# Patient Record
Sex: Male | Born: 1965 | Hispanic: Yes | Marital: Married | Smoking: Never smoker
Health system: Southern US, Community
[De-identification: ages and names within clinical notes are randomized; demographics above are authoritative.]

## PROBLEM LIST (undated history)

## (undated) DIAGNOSIS — I10 Essential (primary) hypertension: Secondary | ICD-10-CM

## (undated) HISTORY — DX: Essential (primary) hypertension: I10

## (undated) HISTORY — PX: OTHER SURGICAL HISTORY: SHX169

## (undated) HISTORY — PX: UMBILICAL HERNIA REPAIR: SHX196

---

## 2011-04-03 DIAGNOSIS — K5900 Constipation, unspecified: Secondary | ICD-10-CM | POA: Insufficient documentation

## 2011-04-03 DIAGNOSIS — E669 Obesity, unspecified: Secondary | ICD-10-CM | POA: Insufficient documentation

## 2013-06-02 ENCOUNTER — Encounter (INDEPENDENT_AMBULATORY_CARE_PROVIDER_SITE_OTHER): Payer: Self-pay

## 2013-06-02 ENCOUNTER — Encounter: Payer: Self-pay | Admitting: Family Medicine

## 2013-06-02 ENCOUNTER — Ambulatory Visit (INDEPENDENT_AMBULATORY_CARE_PROVIDER_SITE_OTHER): Payer: Managed Care, Other (non HMO) | Admitting: Family Medicine

## 2013-06-02 VITALS — BP 134/88 | HR 60 | Temp 97.3°F | Ht 67.5 in | Wt 223.0 lb

## 2013-06-02 DIAGNOSIS — Z0289 Encounter for other administrative examinations: Secondary | ICD-10-CM

## 2013-06-02 DIAGNOSIS — Z0000 Encounter for general adult medical examination without abnormal findings: Secondary | ICD-10-CM

## 2013-06-02 DIAGNOSIS — I10 Essential (primary) hypertension: Secondary | ICD-10-CM

## 2013-06-02 LAB — POCT URINALYSIS DIPSTICK
Bilirubin, UA: NEGATIVE
Blood, UA: NEGATIVE
Glucose, UA: NEGATIVE
Ketones, UA: NEGATIVE
Leukocytes, UA: NEGATIVE
Nitrite, UA: NEGATIVE
Protein, UA: NEGATIVE
Spec Grav, UA: 1.01
Urobilinogen, UA: NEGATIVE
pH, UA: 6.5

## 2013-06-02 MED ORDER — BISOPROLOL-HYDROCHLOROTHIAZIDE 5-6.25 MG PO TABS: 1.0000 | ORAL_TABLET | Freq: Every day | ORAL | Status: DC

## 2013-06-02 NOTE — Patient Instructions (Signed)
Testicular Self-Exam  A self-examination of your testicles involves looking at and feeling your testicles for abnormal lumps or swelling. Several things can cause swelling, lumps, or pain in your testicles. Some of these causes are:  · Injuries.  · Inflammation.  · Infection.  · Accumulation of fluids around your testicle (hydrocele).  · Twisted testicles (testicular torsion).  · Testicular cancer.  Self-examination of the testicles and groin areas may be advised if you are at risk for testicular cancer. Risks for testicular cancer include:  · An undescended testicle (cryptorchidism).  · A history of previous testicular cancer.  · A family history of testicular cancer.  The testicles are easiest to examine after warm baths or showers and are more difficult to examine when you are cold. This is because the muscles attached to the testicles retract and pull them up higher or into the abdomen.  Follow these steps while you are standing:  · Hold your penis away from your body.  · Roll one testicle between your thumb and forefinger, feeling the entire testicle.  · Roll the other testicle between your thumb and forefinger, feeling the entire testicle.  Feel for lumps, swelling, or discomfort. A normal testicle is egg shaped and feels firm. It is smooth and not tender. The spermatic cord can be felt as a firm spaghetti-like cord at the back of your testicle. It is also important to examine the crease between the front of your leg and your abdomen. Feel for any bumps that are tender. These could be enlarged lymph nodes.   Document Released: 08/17/2000 Document Revised: 01/11/2013 Document Reviewed: 10/31/2012  [employer]® Patient Information ©2014 [employer].

## 2013-06-02 NOTE — Progress Notes (Signed)
   Subjective:    Patient ID: Keith BanasJorge Thornsberry, male    DOB: 1965-08-09, 48 y.o.   MRN: [id number]  HPI This 48 y.o. 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  He is up to date with his labs this year But needs a DOT physical.  He has no acute complaints.  He has hx of hypertension and  Needs refills.   Review of Systems No chest pain, SOB, HA, dizziness, vision change, N/V, diarrhea, constipation, dysuria, urinary urgency or frequency, myalgias, arthralgias or rash.     Objective:   Physical Exam  Vital signs noted  Well developed well nourished male.  HEENT - Head atraumatic Normocephalic                Eyes - PERRLA, Conjuctiva - clear Sclera- Clear EOMI                Ears - EAC's Wnl TM's Wnl Gross Hearing WNL                Nose - Nares patent                 Throat - oropharanx wnl Respiratory - Lungs CTA bilateral Cardiac - RRR S1 and S2 without murmur GI - Abdomen soft Nontender and bowel sounds active x 4 Extremities - No edema. Neuro - Grossly intact.      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  Deatra CanterWilliam J Oxford FNP

## 2013-07-21 ENCOUNTER — Ambulatory Visit (INDEPENDENT_AMBULATORY_CARE_PROVIDER_SITE_OTHER): Payer: Managed Care, Other (non HMO) | Admitting: Family Medicine

## 2013-07-21 ENCOUNTER — Ambulatory Visit (INDEPENDENT_AMBULATORY_CARE_PROVIDER_SITE_OTHER): Payer: Managed Care, Other (non HMO)

## 2013-07-21 VITALS — BP 131/92 | HR 61 | Temp 97.1°F | Ht 67.0 in | Wt 222.0 lb

## 2013-07-21 DIAGNOSIS — M549 Dorsalgia, unspecified: Secondary | ICD-10-CM

## 2013-07-21 DIAGNOSIS — M542 Cervicalgia: Secondary | ICD-10-CM

## 2013-07-21 MED ORDER — NAPROXEN 500 MG PO TABS: 500.0000 mg | ORAL_TABLET | Freq: Two times a day (BID) | ORAL | Status: DC

## 2013-07-21 MED ORDER — CYCLOBENZAPRINE HCL 10 MG PO TABS: 10.0000 mg | ORAL_TABLET | Freq: Three times a day (TID) | ORAL | Status: DC | PRN

## 2013-07-21 NOTE — Progress Notes (Signed)
   Subjective:    Patient ID: Keith Blake, male    DOB: 03/28/1966, 48 y.o.   MRN: [id number]  HPI This 48 y.o. male presents for evaluation of back pain that is getting worse.  He states he has  Had back pain for 5 years and it is getting worse.  He c/o night time sx's and pain that effects his Sleeping.  He has been having pain in his mid back to his neck.   Review of Systems No chest pain, SOB, HA, dizziness, vision change, N/V, diarrhea, constipation, dysuria, urinary urgency or frequency or rash.     Objective:   Physical Exam  Vital signs noted  Well developed well nourished male.  HEENT - Head atraumatic Normocephalic                Eyes - PERRLA, Conjuctiva - clear Sclera- Clear EOMI Respiratory - Lungs CTA bilateral Cardiac - RRR S1 and S2 without murmur MS - TTP thoracic and cervical spine paraspinous muscles Neuro - Grossly intact.  Thoracic and cervical spine xrays -  No fracture Prelimnary reading by Chrissie NoaWilliam Oxford,FNP     Assessment & Plan:  Cervicalgia - Plan: naproxen (NAPROSYN) 500 MG tablet, cyclobenzaprine (FLEXERIL) 10 MG tablet  Back Pain - Naprosyn 500mg  one po bid x 10 - 14 days and flexeril 10mg  one po tid prn  Deatra CanterWilliam J Oxford FNP

## 2013-10-20 ENCOUNTER — Ambulatory Visit (INDEPENDENT_AMBULATORY_CARE_PROVIDER_SITE_OTHER): Payer: Managed Care, Other (non HMO) | Admitting: Family Medicine

## 2013-10-20 ENCOUNTER — Encounter: Payer: Self-pay | Admitting: Family Medicine

## 2013-10-20 VITALS — BP 140/90 | HR 63 | Temp 98.1°F | Ht 67.0 in | Wt 220.0 lb

## 2013-10-20 DIAGNOSIS — R109 Unspecified abdominal pain: Secondary | ICD-10-CM

## 2013-10-20 DIAGNOSIS — R1030 Lower abdominal pain, unspecified: Secondary | ICD-10-CM

## 2013-10-20 DIAGNOSIS — N419 Inflammatory disease of prostate, unspecified: Secondary | ICD-10-CM

## 2013-10-20 LAB — POCT UA - MICROSCOPIC ONLY
Bacteria, U Microscopic: NEGATIVE
Casts, Ur, LPF, POC: NEGATIVE
Crystals, Ur, HPF, POC: NEGATIVE
Mucus, UA: NEGATIVE
RBC, urine, microscopic: NEGATIVE
WBC, Ur, HPF, POC: NEGATIVE
Yeast, UA: NEGATIVE

## 2013-10-20 LAB — POCT URINALYSIS DIPSTICK
Bilirubin, UA: NEGATIVE
Blood, UA: NEGATIVE
Glucose, UA: NEGATIVE
Ketones, UA: NEGATIVE
Leukocytes, UA: NEGATIVE
Nitrite, UA: NEGATIVE
Protein, UA: NEGATIVE
Spec Grav, UA: 1.01
Urobilinogen, UA: NEGATIVE
pH, UA: 7

## 2013-10-20 MED ORDER — TAMSULOSIN HCL 0.4 MG PO CAPS: 0.4000 mg | ORAL_CAPSULE | Freq: Every day | ORAL | Status: DC

## 2013-10-20 MED ORDER — CIPROFLOXACIN HCL 500 MG PO TABS: 500.0000 mg | ORAL_TABLET | Freq: Two times a day (BID) | ORAL | Status: DC

## 2013-10-20 NOTE — Progress Notes (Signed)
   Subjective:    Patient ID: Keith Blake, male    DOB: 1966-04-25, 48 y.o.   MRN: [id number]  HPI This 48 y.o. male presents for evaluation of having some discomfort in his perineum. He c/o decreased urine stream and urinary frequency.   Review of Systems C/o urinary frequency No chest pain, SOB, HA, dizziness, vision change, N/V, diarrhea, constipation, myalgias, arthralgias or rash.     Objective:   Physical Exam Vital signs noted  Well developed well nourished male.  HEENT - Head atraumatic Normocephalic                Eyes - PERRLA, Conjuctiva - clear Sclera- Clear EOMI                Ears - EAC's Wnl TM's Wnl Gross Hearing WNL                Throat - oropharanx wnl Respiratory - Lungs CTA bilateral Cardiac - RRR S1 and S2 without murmur GI - Abdomen soft Nontender and bowel sounds active x 4 Extremities - No edema. Neuro - Grossly intact.  Results for orders placed in visit on 10/20/13  POCT UA - MICROSCOPIC ONLY      Result Value Ref Range   WBC, Ur, HPF, POC neg     RBC, urine, microscopic neg     Bacteria, U Microscopic neg     Mucus, UA neg     Epithelial cells, urine per micros occ     Crystals, Ur, HPF, POC neg     Casts, Ur, LPF, POC neg     Yeast, UA neg    POCT URINALYSIS DIPSTICK      Result Value Ref Range   Color, UA yellow     Clarity, UA clear     Glucose, UA neg     Bilirubin, UA neg     Ketones, UA neg     Spec Grav, UA 1.010     Blood, UA neg     pH, UA 7.0     Protein, UA neg     Urobilinogen, UA negative     Nitrite, UA neg     Leukocytes, UA Negative         Assessment & Plan:  Groin discomfort - Plan: POCT UA - Microscopic Only, POCT urinalysis dipstick, tamsulosin (FLOMAX) 0.4 MG CAPS capsule, ciprofloxacin (CIPRO) 500 MG tablet, Urine culture  Prostatitis - Plan: tamsulosin (FLOMAX) 0.4 MG CAPS capsule, ciprofloxacin (CIPRO) 500 MG tablet, Urine culture  Deatra Canter FNP

## 2013-10-22 LAB — URINE CULTURE: Organism ID, Bacteria: NO GROWTH

## 2013-11-28 ENCOUNTER — Ambulatory Visit (INDEPENDENT_AMBULATORY_CARE_PROVIDER_SITE_OTHER): Payer: Managed Care, Other (non HMO) | Admitting: Family Medicine

## 2013-11-28 VITALS — BP 121/82 | HR 63 | Temp 98.4°F | Ht 67.0 in | Wt 219.0 lb

## 2013-11-28 DIAGNOSIS — L259 Unspecified contact dermatitis, unspecified cause: Secondary | ICD-10-CM

## 2013-11-28 MED ORDER — METHYLPREDNISOLONE (PAK) 4 MG PO TABS: ORAL_TABLET | ORAL | Status: DC

## 2013-11-28 MED ORDER — METHYLPREDNISOLONE ACETATE 80 MG/ML IJ SUSP
80.0000 mg | Freq: Once | INTRAMUSCULAR | Status: AC
  Administered 2013-11-28: 80 mg via INTRAMUSCULAR

## 2013-11-28 MED ORDER — HYDROXYZINE HCL 25 MG PO TABS: 25.0000 mg | ORAL_TABLET | Freq: Three times a day (TID) | ORAL | Status: DC | PRN

## 2013-11-28 NOTE — Progress Notes (Signed)
   Subjective:    Patient ID: Keith Blake, male    DOB: November 22, 1965, 48 y.o.   MRN: [id number]  HPI  Patient c/o rash.  He has recently started to take flomax  Review of Systems C/o rash   No chest pain, SOB, HA, dizziness, vision change, N/V, diarrhea, constipation, dysuria, urinary urgency or frequency, myalgias, arthralgias.  Objective:   Physical Exam   Rash on upper extremities, abdomen, chest, and back, rash raised, erythematous, and confluent     Assessment & Plan:  Contact dermatitis - Plan: methylPREDNISolone acetate (DEPO-MEDROL) injection 80 mg, methylPREDNIsolone (MEDROL DOSPACK) 4 MG tablet, hydrOXYzine (ATARAX/VISTARIL) 25 MG tablet Could also be an allergy so recommend stop flomax  Deatra CanterWilliam J Cesily Cuoco FNP

## 2014-04-15 IMAGING — CR DG CERVICAL SPINE COMPLETE 4+V
5 series · 5 of 5 positions shown · non-contrast
Comparison: None.

CLINICAL DATA: Pain.

EXAM:
CERVICAL SPINE  4+ VIEWS

[view not recorded (1 of 5)]
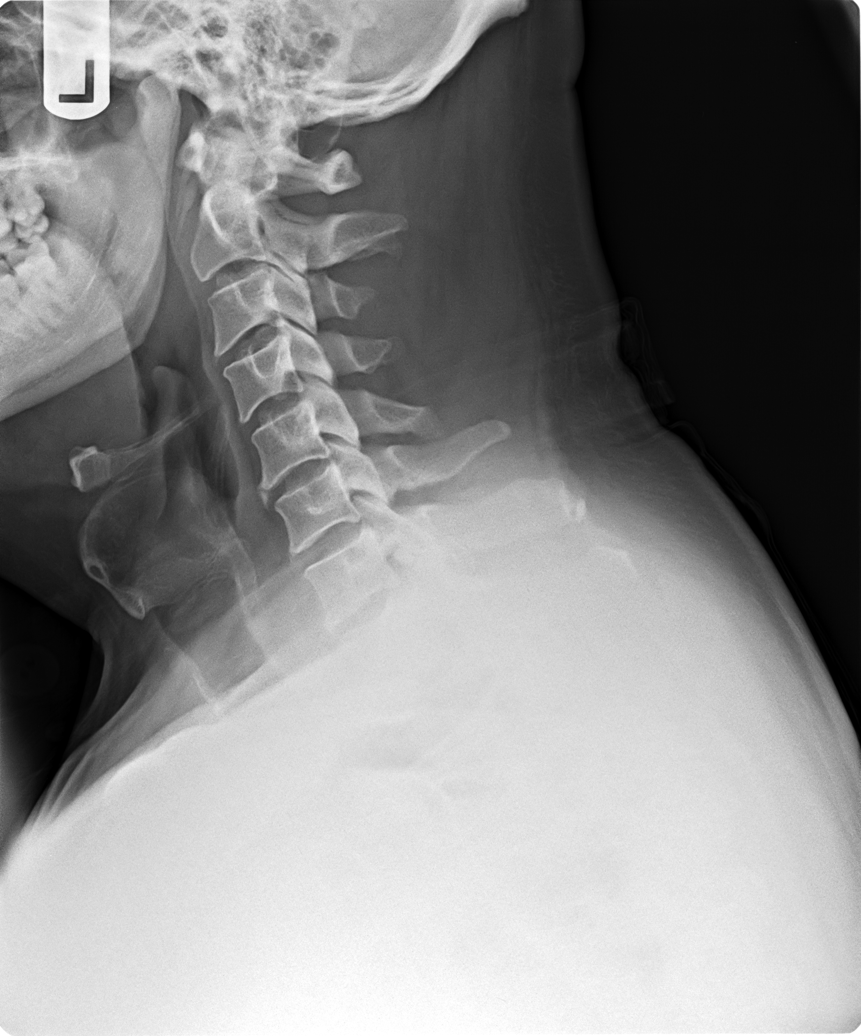

[view not recorded (2 of 5)]
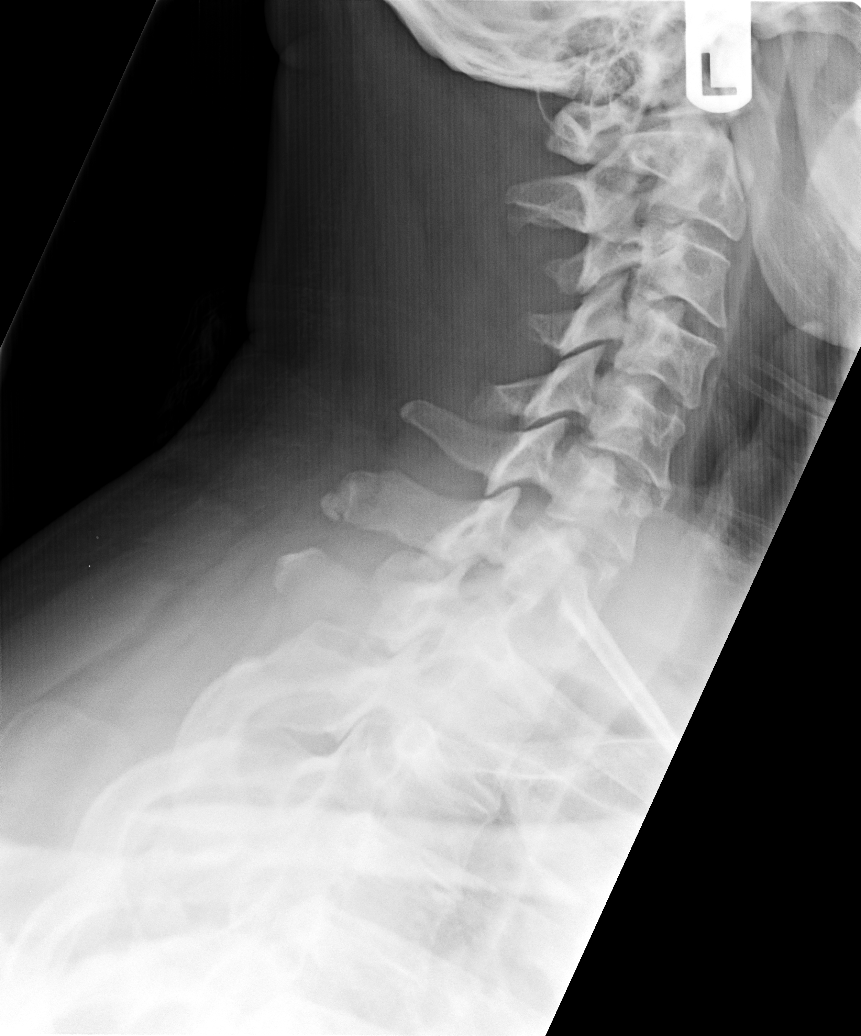

[view not recorded (3 of 5)]
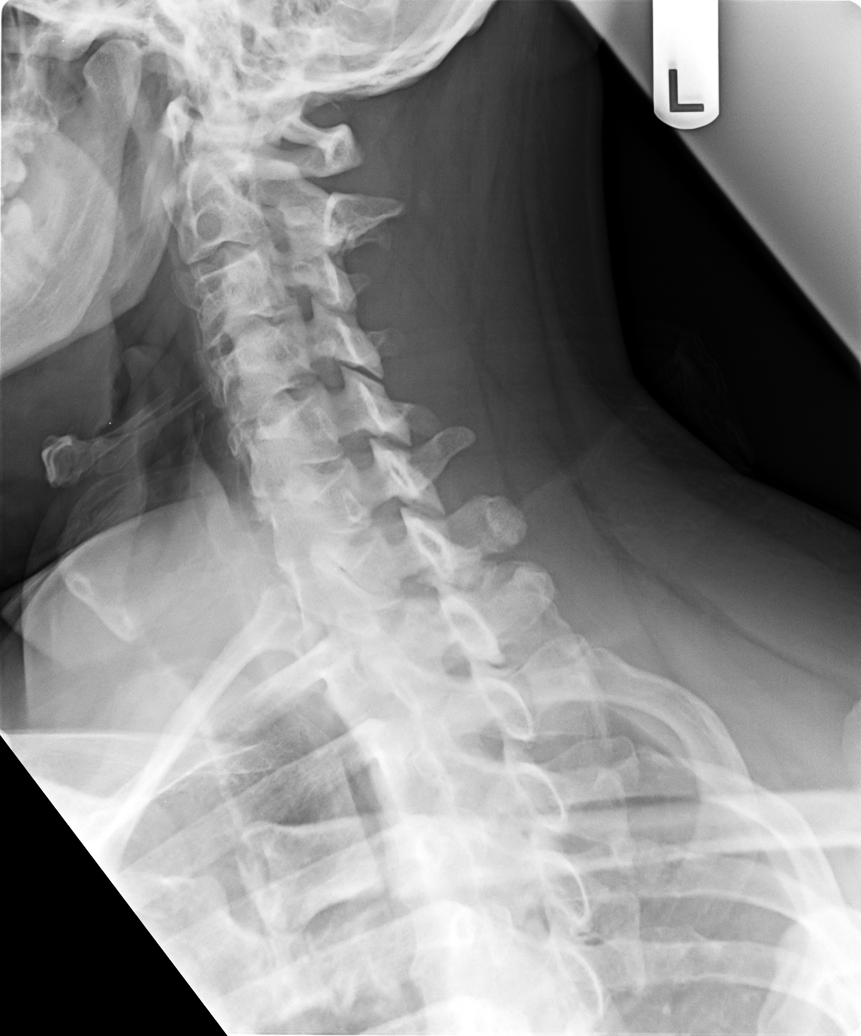

[view not recorded (4 of 5)]
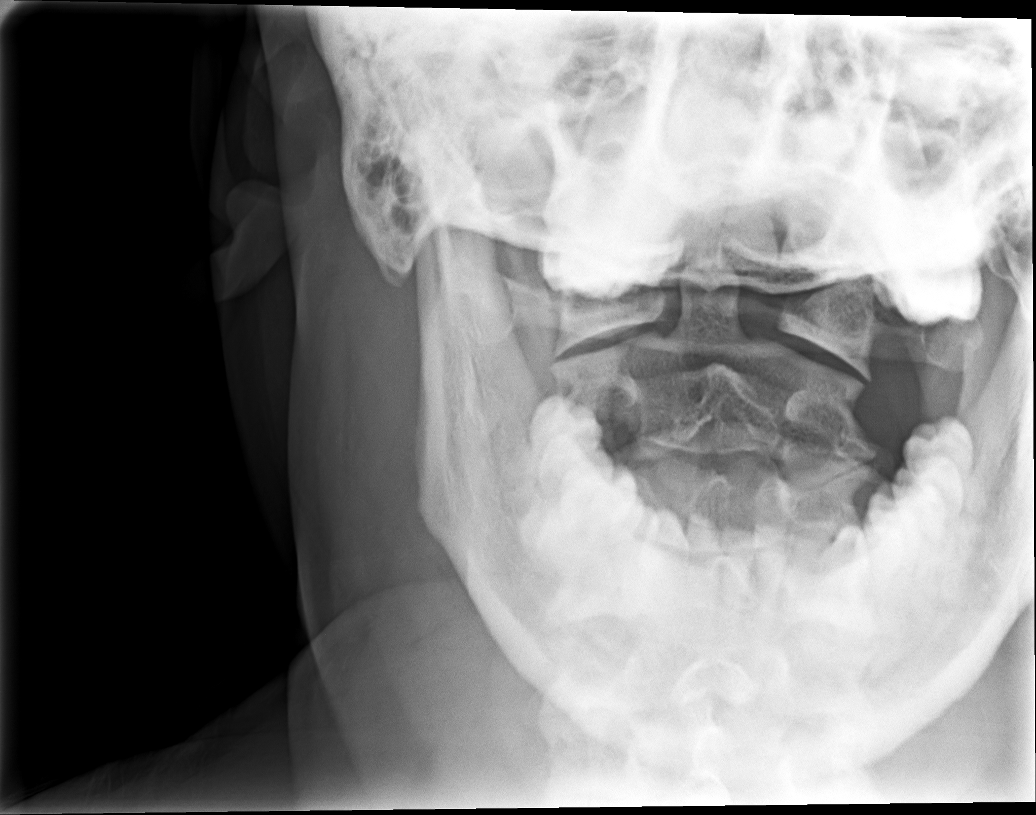

[view not recorded (5 of 5)]
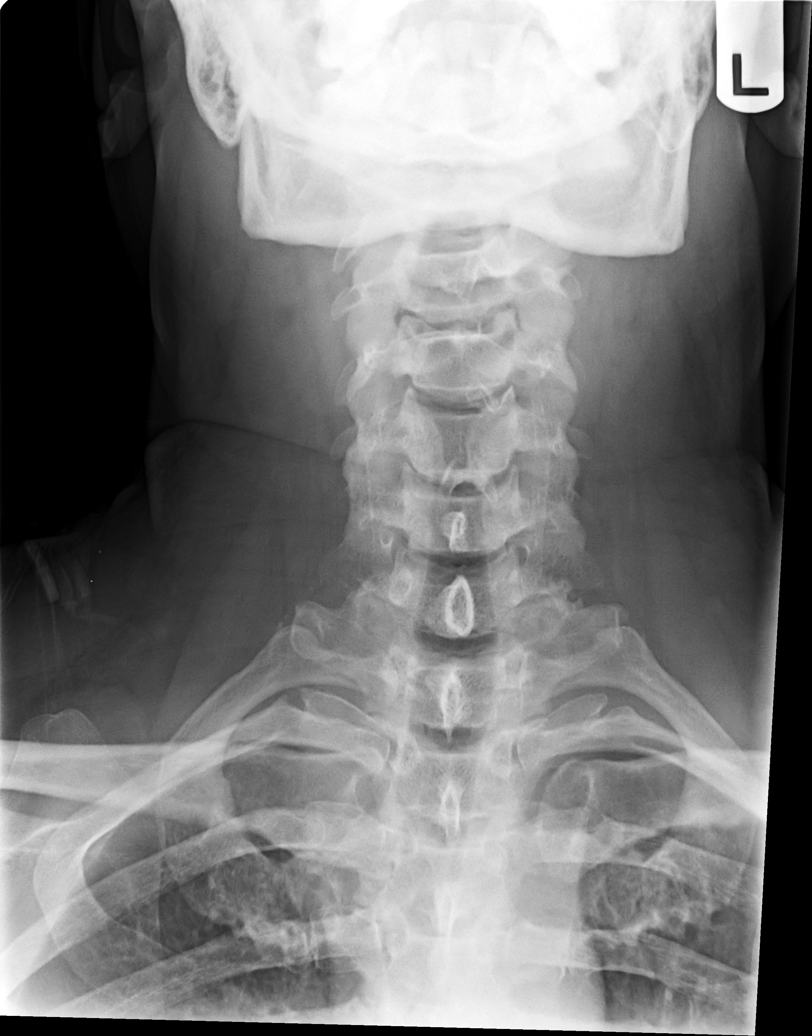

[5 of 5 positions shown; findings below may reference images not displayed]

FINDINGS: Vertebral body height and alignment are normal. Intervertebral disc
space height is maintained. Facet joints are unremarkable.
Prevertebral soft tissues appear normal. Lung apices are clear.
IMPRESSION: Negative exam.

## 2014-06-07 ENCOUNTER — Ambulatory Visit (INDEPENDENT_AMBULATORY_CARE_PROVIDER_SITE_OTHER): Payer: Managed Care, Other (non HMO) | Admitting: Family Medicine

## 2014-06-07 ENCOUNTER — Encounter: Payer: Self-pay | Admitting: Family Medicine

## 2014-06-07 VITALS — BP 128/86 | HR 64 | Temp 98.2°F | Ht 67.5 in | Wt 220.2 lb

## 2014-06-07 DIAGNOSIS — Z0000 Encounter for general adult medical examination without abnormal findings: Secondary | ICD-10-CM

## 2014-06-07 DIAGNOSIS — Z024 Encounter for examination for driving license: Secondary | ICD-10-CM

## 2014-06-07 LAB — POCT URINALYSIS DIPSTICK
Bilirubin, UA: NEGATIVE
Blood, UA: NEGATIVE
Glucose, UA: NEGATIVE
Ketones, UA: NEGATIVE
Leukocytes, UA: NEGATIVE
Nitrite, UA: NEGATIVE
Protein, UA: NEGATIVE
Spec Grav, UA: <=1.005
Urobilinogen, UA: NEGATIVE
pH, UA: 5

## 2014-06-07 NOTE — Progress Notes (Signed)
   Subjective:    Patient ID: Keith BanasJorge Mies, male    DOB: 1966-02-07, 49 y.o.   MRN: [id number]  HPI Patient is here for DOT physical.  Review of Systems  Constitutional: Negative for fever.  HENT: Negative for ear pain.   Eyes: Negative for discharge.  Respiratory: Negative for cough.   Cardiovascular: Negative for chest pain.  Gastrointestinal: Negative for abdominal distention.  Endocrine: Negative for polyuria.  Genitourinary: Negative for difficulty urinating.  Musculoskeletal: Negative for gait problem and neck pain.  Skin: Negative for color change and rash.  Neurological: Negative for speech difficulty and headaches.  Psychiatric/Behavioral: Negative for agitation.       Objective:    BP 128/86 mmHg  Pulse 64  Temp(Src) 98.2 F (36.8 C) (Oral)  Ht 5' 7.5" (1.715 m)  Wt 220 lb 3.2 oz (99.882 kg)  BMI 33.96 kg/m2 Physical Exam  Constitutional: He is oriented to person, place, and time. He appears well-developed and well-nourished.  HENT:  Head: Normocephalic and atraumatic.  Mouth/Throat: Oropharynx is clear and moist.  Eyes: Pupils are equal, round, and reactive to light.  Neck: Normal range of motion. Neck supple.  Cardiovascular: Normal rate and regular rhythm.   No murmur heard. Pulmonary/Chest: Effort normal and breath sounds normal.  Abdominal: Soft. Bowel sounds are normal. There is no tenderness.  Neurological: He is alert and oriented to person, place, and time.  Skin: Skin is warm and dry.  Psychiatric: He has a normal mood and affect.          Assessment & Plan:     ICD-9-CM ICD-10-CM   1. Annual physical exam V70.0 Z00.00 POCT urinalysis dipstick     No Follow-up on file.  Deatra CanterWilliam J Dallan Schonberg FNP

## 2014-06-08 ENCOUNTER — Telehealth: Payer: Self-pay | Admitting: *Deleted

## 2014-06-09 ENCOUNTER — Other Ambulatory Visit: Payer: Self-pay | Admitting: Family Medicine

## 2014-06-21 ENCOUNTER — Encounter: Payer: Self-pay | Admitting: Family Medicine

## 2014-06-21 ENCOUNTER — Ambulatory Visit (INDEPENDENT_AMBULATORY_CARE_PROVIDER_SITE_OTHER): Payer: 59 | Admitting: Family Medicine

## 2014-06-21 VITALS — BP 140/91 | HR 82 | Temp 98.5°F | Ht 67.5 in | Wt 221.0 lb

## 2014-06-21 DIAGNOSIS — Z91048 Other nonmedicinal substance allergy status: Secondary | ICD-10-CM

## 2014-06-21 DIAGNOSIS — L259 Unspecified contact dermatitis, unspecified cause: Secondary | ICD-10-CM

## 2014-06-21 DIAGNOSIS — T783XXA Angioneurotic edema, initial encounter: Secondary | ICD-10-CM

## 2014-06-21 DIAGNOSIS — Z9109 Other allergy status, other than to drugs and biological substances: Secondary | ICD-10-CM

## 2014-06-21 MED ORDER — PREDNISONE 10 MG PO TABS: ORAL_TABLET | ORAL | Status: DC

## 2014-06-21 MED ORDER — METHYLPREDNISOLONE ACETATE 80 MG/ML IJ SUSP
80.0000 mg | Freq: Once | INTRAMUSCULAR | Status: AC
  Administered 2014-06-21: 80 mg via INTRAMUSCULAR

## 2014-06-21 MED ORDER — HYDROXYZINE HCL 25 MG PO TABS: 25.0000 mg | ORAL_TABLET | Freq: Three times a day (TID) | ORAL | Status: DC | PRN

## 2014-06-21 NOTE — Progress Notes (Signed)
   Subjective:    Patient ID: Keith Blake, male    DOB: 1966-03-26, 49 y.o.   MRN: [id number]  HPI Patient is here for allergies and rash.  He is having facial swelling. He is unsure what he has allergies to.  He has been taking the HTN medicine for 3 years.  Review of Systems  Constitutional: Negative for fever.  HENT: Negative for ear pain.   Eyes: Negative for discharge.  Respiratory: Negative for cough.   Cardiovascular: Negative for chest pain.  Gastrointestinal: Negative for abdominal distention.  Endocrine: Negative for polyuria.  Genitourinary: Negative for difficulty urinating.  Musculoskeletal: Negative for gait problem and neck pain.  Skin: Negative for color change and rash.  Neurological: Negative for speech difficulty and headaches.  Psychiatric/Behavioral: Negative for agitation.       Objective:    BP 140/91 mmHg  Pulse 82  Temp(Src) 98.5 F (36.9 C) (Oral)  Ht 5' 7.5" (1.715 m)  Wt 221 lb (100.245 kg)  BMI 34.08 kg/m2 Physical Exam  Constitutional: He is oriented to person, place, and time. He appears well-developed and well-nourished.  HENT:  Head: Normocephalic and atraumatic.  Mouth/Throat: Oropharynx is clear and moist.  Eyes: Pupils are equal, round, and reactive to light.  Neck: Normal range of motion. Neck supple.  Cardiovascular: Normal rate and regular rhythm.   No murmur heard. Pulmonary/Chest: Effort normal and breath sounds normal.  Abdominal: Soft. Bowel sounds are normal. He exhibits mass. There is no tenderness.  Neurological: He is alert and oriented to person, place, and time.  Skin: Skin is warm and dry.  Psychiatric: He has a normal mood and affect.          Assessment & Plan:     ICD-9-CM ICD-10-CM   1. Contact dermatitis 692.9 L25.9   2. Environmental allergies V15.09 Z91.048 methylPREDNISolone acetate (DEPO-MEDROL) injection 80 mg     predniSONE (DELTASONE) 10 MG tablet     hydrOXYzine (ATARAX/VISTARIL) 25 MG tablet     Ambulatory referral to Allergy  3. Angioedema, initial encounter 995.1 T78.3XXA methylPREDNISolone acetate (DEPO-MEDROL) injection 80 mg     predniSONE (DELTASONE) 10 MG tablet     hydrOXYzine (ATARAX/VISTARIL) 25 MG tablet     Ambulatory referral to Allergy     No Follow-up on file.  Deatra CanterWilliam J Oxford FNP

## 2014-07-17 ENCOUNTER — Telehealth: Payer: Self-pay | Admitting: Family Medicine

## 2014-07-17 NOTE — Telephone Encounter (Signed)
Pt notified that he currently has refills Confirmed with [facility]

## 2014-08-15 ENCOUNTER — Other Ambulatory Visit: Payer: 59

## 2014-08-15 ENCOUNTER — Encounter: Payer: Self-pay | Admitting: Internal Medicine

## 2014-08-15 ENCOUNTER — Ambulatory Visit (INDEPENDENT_AMBULATORY_CARE_PROVIDER_SITE_OTHER): Payer: 59 | Admitting: Internal Medicine

## 2014-08-15 VITALS — BP 128/88 | HR 79 | Ht 67.0 in | Wt 217.6 lb

## 2014-08-15 DIAGNOSIS — L509 Urticaria, unspecified: Secondary | ICD-10-CM

## 2014-08-15 DIAGNOSIS — L500 Allergic urticaria: Secondary | ICD-10-CM | POA: Diagnosis not present

## 2014-08-15 NOTE — Progress Notes (Signed)
Subjective:    Patient ID: Keith BanasJorge Blake, male    DOB: 1965/06/21, 49 y.o.   MRN: [id number]  HPI 3//23/16- [age] yoM never smoker Referred by Dr Purcell Nailsxford for Environmental Allergies : pt reports hives x 1 week ago, itching and swollen eyes.   First onset of itching with rash that sounds like urticaria was thought may be reaction to an unspecified food. Episode resolved with "pills"-prednisone?Marland Kitchen. Now recurrent episodes of urticaria on limbs and trunk intermittently 3 months and most recently one week ago. Benadryl helps when taken at onset. Individual lesions usually last about 6 hours. 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 Has not noticed other seasonal pattern, relationship to specific medications or foods. There is no family history of urticaria/angioedema.  Prior to Admission medications   Medication Sig Start Date End Date Taking? Authorizing Provider  bisoprolol-hydrochlorothiazide (ZIAC) 5-6.25 MG per tablet TAKE 1 TABLET BY MOUTH DAILY. 06/11/14  Yes Deatra CanterWilliam J Oxford, FNP  diphenhydrAMINE (BENADRYL) 25 mg capsule Take 25 mg by mouth every 6 (six) hours as needed for itching or allergies.   Yes Historical Provider, MD   Past Medical History  Diagnosis Date  . Hypertension    Past Surgical History  Procedure Laterality Date  . Umbilical hernia repair     No family history on file.   History   Social History  . Marital Status: Married    Spouse Name: N/A  . Number of Children: N/A  . Years of Education: N/A   Occupational History  . Welder    Social History Main Topics  . Smoking status: Never Smoker   . Smokeless tobacco: Not on file  . Alcohol Use: 0.0 oz/week    0 Standard drinks or equivalent per week     Comment: occ  . Drug Use: No  . Sexual Activity: Not on file   Other Topics Concern  . Not on file   Social History Narrative    Review of Systems  Constitutional: Negative for fever and  unexpected weight change.  HENT: Negative for congestion, dental problem, ear pain, nosebleeds, postnasal drip, rhinorrhea, sinus pressure, sneezing, sore throat and trouble swallowing.   Eyes: Negative for redness and itching.  Respiratory: Negative for cough, chest tightness, shortness of breath and wheezing.   Cardiovascular: Negative for palpitations and leg swelling.  Gastrointestinal: Negative for nausea and vomiting.  Genitourinary: Negative for dysuria.  Musculoskeletal: Negative for joint swelling.  Skin: Positive for rash.       Hives all over body within the last month to 1 week  Neurological: Negative for headaches.  Hematological: Does not bruise/bleed easily.  Psychiatric/Behavioral: Negative for dysphoric mood. The patient is not nervous/anxious.       Objective:   Physical Exam OBJ- Physical Exam General- Alert, Oriented, Affect-appropriate, Distress- none acute, +overweight Skin- rash-none, lesions- none, excoriation- none Lymphadenopathy- none Head- atraumatic            Eyes- Gross vision intact, PERRLA, conjunctivae and secretions clear            Ears- Hearing, canals-normal            Nose- Clear, no-Septal dev, mucus, polyps, erosion, perforation             Throat- Mallampati II , mucosa clear , drainage- none, tonsils- atrophic Neck- flexible , trachea midline, no stridor , thyroid nl, carotid no bruit Chest -  symmetrical excursion , unlabored           Heart/CV- RRR , no murmur , no gallop  , no rub, nl s1 s2                           - JVD- none , edema- none, stasis changes- none, varices- none           Lung- clear to P&A, wheeze- none, cough- none , dullness-none, rub- none           Chest wall-  Abd-  Br/ Gen/ Rectal- Not done, not indicated Extrem- cyanosis- none, clubbing, none, atrophy- none, strength- nl Neuro- grossly intact to observation      Assessment & Plan:

## 2014-08-15 NOTE — Assessment & Plan Note (Signed)
Recurrent urticaria by description, mostly over the last 3 months. He denies other, and allergy symptoms, underlying diseases or obvious exposure triggers. This may be a post viral triggered process and not atopic. Plan-in vitro allergy assessment. Try daily combination of H1 and H2 antihistamines (Allegra and Pepcid).

## 2014-08-15 NOTE — Patient Instructions (Signed)
I think the rash you are having is hives or "urticaria"  Try taking 2 classes of antihistamines each day for prevention: These are over the counter. Allegra/ fexofenadine Pepcid/ famotadine  You can still add benadryl if you need it for breakthrough rash.   Order- lab- Allergy profile, Food IgE profile, alpha-gal IgE, Aspirin IgE    Dx urticaria

## 2014-08-16 LAB — ALLERGY FULL PROFILE
Allergen, D pternoyssinus,d7: 0.19 kU/L — ABNORMAL HIGH
Aspergillus fumigatus, m3: <0.1 kU/L
BERMUDA GRASS: 2.22 kU/L — AB
Bahia Grass: 2.31 kU/L — ABNORMAL HIGH
Box Elder IgE: 2.61 kU/L — ABNORMAL HIGH
COMMON RAGWEED: 2.16 kU/L — AB
Cat Dander: 0.13 kU/L — ABNORMAL HIGH
Curvularia lunata: <0.1 kU/L
D. farinae: 0.27 kU/L — ABNORMAL HIGH
Dog Dander: 0.32 kU/L — ABNORMAL HIGH
Elm IgE: 2.12 kU/L — ABNORMAL HIGH
Fescue: 2.08 kU/L — ABNORMAL HIGH
G005 Rye, Perennial: 1.98 kU/L — ABNORMAL HIGH
G009 RED TOP: 1.96 kU/L — AB
Goldenrod: 1.61 kU/L — ABNORMAL HIGH
Helminthosporium halodes: <0.1 kU/L
House Dust Hollister: 0.25 kU/L — ABNORMAL HIGH
Lamb's Quarters: 2.01 kU/L — ABNORMAL HIGH
Oak: 1.82 kU/L — ABNORMAL HIGH
PLANTAIN: 1.81 kU/L — AB
Sycamore Tree: 1.83 kU/L — ABNORMAL HIGH
Timothy Grass: 1.93 kU/L — ABNORMAL HIGH

## 2014-08-16 LAB — ALLERGEN FOOD PROFILE SPECIFIC IGE
Apple: 1.17 kU/L — ABNORMAL HIGH
Corn: 1.37 kU/L — ABNORMAL HIGH
Fish Cod: <0.1 kU/L
IgE (Immunoglobulin E), Serum: 137 kU/L — ABNORMAL HIGH (ref <115)
Milk IgE: 0.29 kU/L — ABNORMAL HIGH
Orange: 1.49 kU/L — ABNORMAL HIGH
PEANUT IGE: 1.93 kU/L — AB
SHRIMP IGE: 3.65 kU/L — AB
Soybean IgE: 1.21 kU/L — ABNORMAL HIGH
Tomato IgE: 1.89 kU/L — ABNORMAL HIGH
Wheat IgE: 1.73 kU/L — ABNORMAL HIGH

## 2014-08-18 LAB — ALPHA GAL IGE: ALPHA GAL IGE: 5.26 kU/L — AB (ref <0.35)

## 2014-08-22 LAB — ALLERGEN ASPIRIN/SALICYLIC ACID IGE
ASPIRIN: 0 [IU]/mL (ref <0.05)
CLASS ASDF: NEGATIVE

## 2014-08-30 NOTE — Progress Notes (Signed)
Quick Note:  [employer] [phone]) to translate to spanish. LMTCB with pt's wife. ______

## 2014-08-31 ENCOUNTER — Telehealth: Payer: Self-pay | Admitting: Internal Medicine

## 2014-08-31 NOTE — Telephone Encounter (Signed)
Notes Recorded by Waymon Budgelinton D Young, MD on 08/23/2014 at 8:19 PM Allergy antibodies elevated against a lot of common environmental triggers like pollens and dust, and also against a variety of foods, Don't eat foods that clearly cause problems. We will review results at next ov --- Attempted to call pt's work number but he did not leave an extension where we can reach him. Called pt's home number, no answer and option to leave a message.

## 2014-09-03 NOTE — Telephone Encounter (Signed)
Pt returning call  [phone]

## 2014-09-03 NOTE — Telephone Encounter (Signed)
Spoke with pt, he is aware of results/recs.  nothing further needed.

## 2014-09-03 NOTE — Telephone Encounter (Signed)
Attempted to contact pt again. No answer, no option to leave a message.

## 2014-10-29 ENCOUNTER — Ambulatory Visit: Payer: 59 | Admitting: Internal Medicine

## 2014-12-25 ENCOUNTER — Other Ambulatory Visit: Payer: Self-pay | Admitting: *Deleted

## 2014-12-25 MED ORDER — BISOPROLOL-HYDROCHLOROTHIAZIDE 5-6.25 MG PO TABS: 1.0000 | ORAL_TABLET | Freq: Every day | ORAL | Status: DC

## 2015-01-25 ENCOUNTER — Other Ambulatory Visit: Payer: Self-pay | Admitting: Family Medicine

## 2015-04-05 ENCOUNTER — Ambulatory Visit (INDEPENDENT_AMBULATORY_CARE_PROVIDER_SITE_OTHER): Payer: Commercial Managed Care - HMO | Admitting: Family

## 2015-04-05 VITALS — BP 143/87 | HR 61 | Temp 99.1°F | Ht 67.0 in | Wt 215.4 lb

## 2015-04-05 DIAGNOSIS — R1010 Upper abdominal pain, unspecified: Secondary | ICD-10-CM | POA: Diagnosis not present

## 2015-04-05 NOTE — Progress Notes (Signed)
   Subjective:    Patient ID: Keith Blake, male    DOB: 1965/06/08, 49 y.o.   MRN: [id number]  Abdominal Pain This is a new problem. The current episode started more than 1 month ago. The onset quality is gradual. The problem occurs intermittently. The problem has been unchanged. The pain is located in the LUQ, RUQ and epigastric region. The pain is at a severity of 10/10. The pain is moderate. The abdominal pain does not radiate. Associated symptoms include belching and diarrhea. Pertinent negatives include no constipation, fever, flatus, frequency, nausea or vomiting. Associated symptoms comments: Sweats, and bilateral palm itch . Nothing aggravates the pain. The pain is relieved by nothing. He has tried nothing for the symptoms. The treatment provided no relief.      Review of Systems  Constitutional: Negative.  Negative for fever.  HENT: Negative.   Respiratory: Negative.   Cardiovascular: Negative.   Gastrointestinal: Positive for abdominal pain and diarrhea. Negative for nausea, vomiting, constipation and flatus.  Endocrine: Negative.   Genitourinary: Negative.  Negative for frequency.  Musculoskeletal: Negative.   Neurological: Negative.   Hematological: Negative.   Psychiatric/Behavioral: Negative.   All other systems reviewed and are negative.      Objective:   Physical Exam  Constitutional: He is oriented to person, place, and time. He appears well-developed and well-nourished. No distress.  HENT:  Head: Normocephalic.  Neck: Normal range of motion. Neck supple. No thyromegaly present.  Cardiovascular: Normal rate, regular rhythm, normal heart sounds and intact distal pulses.   No murmur heard. Pulmonary/Chest: Effort normal and breath sounds normal. No respiratory distress. He has no wheezes.  Abdominal: Soft. Bowel sounds are normal. He exhibits no distension. There is tenderness (mild generalized upper abd pain).  Musculoskeletal: Normal range of motion. He  exhibits no edema or tenderness.  Neurological: He is alert and oriented to person, place, and time. He has normal reflexes. No cranial nerve deficit.  Skin: Skin is warm and dry. No rash noted. No erythema.  Psychiatric: He has a normal mood and affect. His behavior is normal. Judgment and thought content normal.  Vitals reviewed.   BP 143/87 mmHg  Pulse 61  Temp(Src) 99.1 F (37.3 C) (Oral)  Ht $R'5\' 7"'Mn$  (1.702 m)  Wt 215 lb 6.4 oz (97.705 kg)  BMI 33.73 kg/m2       Assessment & Plan:  1. Upper abdominal pain -Force fluids -Bland diet -Labs pending -RTO in 1 week -Go to ED if abd pain becomes worse - CMP14+EGFR - H Pylori, IGM, IGG, IGA AB - Amylase - Lipase - CBC with Differential/Platelet - Alpha-Gal Panel  Evelina Dun, FNP

## 2015-04-05 NOTE — Patient Instructions (Signed)

## 2015-04-11 ENCOUNTER — Other Ambulatory Visit: Payer: Self-pay | Admitting: Family

## 2015-04-11 LAB — CBC WITH DIFFERENTIAL/PLATELET
BASOS ABS: 0 10*3/uL (ref 0.0–0.2)
Basos: 0 %
EOS (ABSOLUTE): 0.1 10*3/uL (ref 0.0–0.4)
Eos: 1 %
HEMOGLOBIN: 14.2 g/dL (ref 12.6–17.7)
Hematocrit: 42.7 % (ref 37.5–51.0)
IMMATURE GRANS (ABS): 0 10*3/uL (ref 0.0–0.1)
Immature Granulocytes: 0 %
LYMPHS ABS: 2.6 10*3/uL (ref 0.7–3.1)
LYMPHS: 35 %
MCH: 30.2 pg (ref 26.6–33.0)
MCHC: 33.3 g/dL (ref 31.5–35.7)
MCV: 91 fL (ref 79–97)
MONOCYTES: 8 %
Monocytes Absolute: 0.6 10*3/uL (ref 0.1–0.9)
Neutrophils Absolute: 4.1 10*3/uL (ref 1.4–7.0)
Neutrophils: 56 %
PLATELETS: 262 10*3/uL (ref 150–379)
RBC: 4.7 x10E6/uL (ref 4.14–5.80)
RDW: 13.3 % (ref 12.3–15.4)
WBC: 7.4 10*3/uL (ref 3.4–10.8)

## 2015-04-11 LAB — CMP14+EGFR
A/G RATIO: 1.8 (ref 1.1–2.5)
ALT: 43 IU/L (ref 0–44)
AST: 42 IU/L — ABNORMAL HIGH (ref 0–40)
Albumin: 4.4 g/dL (ref 3.5–5.5)
Alkaline Phosphatase: 50 IU/L (ref 39–117)
BILIRUBIN TOTAL: 0.5 mg/dL (ref 0.0–1.2)
BUN / CREAT RATIO: 15 (ref 9–20)
BUN: 14 mg/dL (ref 6–24)
CO2: 24 mmol/L (ref 18–29)
CREATININE: 0.93 mg/dL (ref 0.76–1.27)
Calcium: 9.2 mg/dL (ref 8.7–10.2)
Chloride: 99 mmol/L (ref 97–106)
GFR calc Af Amer: 111 mL/min/{1.73_m2} (ref >59)
GFR, EST NON AFRICAN AMERICAN: 96 mL/min/{1.73_m2} (ref >59)
GLUCOSE: 97 mg/dL (ref 65–99)
Globulin, Total: 2.5 g/dL (ref 1.5–4.5)
Potassium: 4 mmol/L (ref 3.5–5.2)
SODIUM: 139 mmol/L (ref 136–144)
Total Protein: 6.9 g/dL (ref 6.0–8.5)

## 2015-04-11 LAB — AMYLASE: AMYLASE: 105 U/L (ref 31–124)

## 2015-04-11 LAB — H PYLORI, IGM, IGG, IGA AB
H Pylori IgG: <0.9 U/mL (ref 0.0–0.8)
H pylori, IgM Abs: <9 units (ref 0.0–8.9)
H. pylori, IgA Abs: <9 units (ref 0.0–8.9)

## 2015-04-11 LAB — ALPHA-GAL PANEL
Alpha Gal IgE*: 6.3 kU/L — ABNORMAL HIGH (ref <0.35)
BEEF (BOS SPP) IGE: 4.89 kU/L — AB (ref <0.35)
BEEF CLASS INTERPRETATION: 3
Class Interpretation: 2
Class Interpretation: 2
Lamb/Mutton (Ovis spp) IgE: 0.94 kU/L — ABNORMAL HIGH (ref <0.35)
PORK (SUS SPP) IGE: 1.77 kU/L — AB (ref <0.35)

## 2015-04-11 LAB — LIPASE: LIPASE: 39 U/L (ref 0–59)

## 2015-04-11 MED ORDER — EPINEPHRINE 0.3 MG/0.3ML IJ SOAJ: 0.3000 mg | Freq: Once | INTRAMUSCULAR | Status: DC

## 2015-04-12 ENCOUNTER — Ambulatory Visit: Payer: Commercial Managed Care - HMO | Admitting: Family

## 2015-04-12 ENCOUNTER — Telehealth: Payer: Self-pay | Admitting: Family

## 2015-04-12 MED ORDER — BISOPROLOL-HYDROCHLOROTHIAZIDE 5-6.25 MG PO TABS: 1.0000 | ORAL_TABLET | Freq: Every day | ORAL | Status: DC

## 2015-04-12 NOTE — Telephone Encounter (Signed)
Prescription sent to pharmacy.

## 2015-04-15 ENCOUNTER — Encounter: Payer: Self-pay | Admitting: Family Medicine

## 2015-05-06 ENCOUNTER — Ambulatory Visit (INDEPENDENT_AMBULATORY_CARE_PROVIDER_SITE_OTHER): Payer: Commercial Managed Care - HMO | Admitting: Family

## 2015-05-06 VITALS — BP 131/78 | HR 74 | Temp 98.1°F | Ht 67.0 in | Wt 215.0 lb

## 2015-05-06 DIAGNOSIS — R300 Dysuria: Secondary | ICD-10-CM | POA: Diagnosis not present

## 2015-05-06 DIAGNOSIS — R350 Frequency of micturition: Secondary | ICD-10-CM | POA: Diagnosis not present

## 2015-05-06 DIAGNOSIS — N4281 Prostatodynia syndrome: Secondary | ICD-10-CM

## 2015-05-06 LAB — POCT URINALYSIS DIPSTICK
Bilirubin, UA: NEGATIVE
GLUCOSE UA: NEGATIVE
Ketones, UA: NEGATIVE
LEUKOCYTES UA: NEGATIVE
NITRITE UA: NEGATIVE
Protein, UA: NEGATIVE
Spec Grav, UA: 1.02
UROBILINOGEN UA: NEGATIVE
pH, UA: 6

## 2015-05-06 LAB — POCT UA - MICROSCOPIC ONLY
BACTERIA, U MICROSCOPIC: NEGATIVE
Casts, Ur, LPF, POC: NEGATIVE
Crystals, Ur, HPF, POC: NEGATIVE
EPITHELIAL CELLS, URINE PER MICROSCOPY: NEGATIVE
MUCUS UA: NEGATIVE
RBC, URINE, MICROSCOPIC: NEGATIVE
WBC, UR, HPF, POC: NEGATIVE
Yeast, UA: NEGATIVE

## 2015-05-06 MED ORDER — CIPROFLOXACIN HCL 500 MG PO TABS: 500.0000 mg | ORAL_TABLET | Freq: Two times a day (BID) | ORAL | Status: DC

## 2015-05-06 NOTE — Patient Instructions (Signed)

## 2015-05-06 NOTE — Progress Notes (Signed)
   Subjective:    Patient ID: Keith Blake, male    DOB: June 20, 1965, 49 y.o.   MRN: [id number]  Dysuria  This is a new problem. The current episode started 1 to 4 weeks ago. The problem occurs intermittently. The problem has been gradually worsening. The quality of the pain is described as burning (pressure). The pain is at a severity of 10/10. The pain is mild. There has been no fever. Associated symptoms include frequency, nausea and urgency. Pertinent negatives include no chills, discharge, flank pain, hematuria, hesitancy or vomiting. He has tried increased fluids for the symptoms. The treatment provided mild relief.      Review of Systems  Constitutional: Negative.  Negative for chills.  HENT: Negative.   Respiratory: Negative.   Cardiovascular: Negative.   Gastrointestinal: Positive for nausea. Negative for vomiting.  Endocrine: Negative.   Genitourinary: Positive for dysuria, urgency and frequency. Negative for hesitancy, hematuria and flank pain.  Musculoskeletal: Negative.   Neurological: Negative.   Hematological: Negative.   Psychiatric/Behavioral: Negative.   All other systems reviewed and are negative.      Objective:   Physical Exam  Constitutional: He is oriented to person, place, and time. He appears well-developed and well-nourished. No distress.  HENT:  Head: Normocephalic.  Right Ear: External ear normal.  Left Ear: External ear normal.  Mouth/Throat: Oropharynx is clear and moist.  Eyes: Pupils are equal, round, and reactive to light. Right eye exhibits no discharge. Left eye exhibits no discharge.  Neck: Normal range of motion. Neck supple. No thyromegaly present.  Cardiovascular: Normal rate, regular rhythm, normal heart sounds and intact distal pulses.   No murmur heard. Pulmonary/Chest: Effort normal and breath sounds normal. No respiratory distress. He has no wheezes.  Abdominal: Soft. Bowel sounds are normal. He exhibits no distension. There is no  tenderness.  Genitourinary: Rectum normal and penis normal.  Prostate firm   Musculoskeletal: Normal range of motion. He exhibits no edema or tenderness.  Negative for CVA tenderness   Neurological: He is alert and oriented to person, place, and time. He has normal reflexes. No cranial nerve deficit.  Skin: Skin is warm and dry. No rash noted. No erythema.  Psychiatric: He has a normal mood and affect. His behavior is normal. Judgment and thought content normal.  Vitals reviewed.     BP 131/78 mmHg  Pulse 74  Temp(Src) 98.1 F (36.7 C) (Oral)  Ht _0  (1.702 m)  Wt 215 lb (97.523 kg)  BMI 33.67 kg/m2     Assessment & Plan:  1. Urinary frequency - POCT urinalysis dipstick - POCT UA - Microscopic Only  2. Dysuria  3. Prostate pain - CBC with Differential/Platelet - CMP14+EGFR - PSA, total and free  Labs pending Force fluids Will start on antibiotic since patient having so much pain and pressure  RTO in 2 weeks Evelina Dun, FNP

## 2015-05-07 LAB — PSA, TOTAL AND FREE
PSA FREE PCT: 43.3 %
PSA, Free: 0.13 ng/mL
Prostate Specific Ag, Serum: 0.3 ng/mL (ref 0.0–4.0)

## 2015-05-07 LAB — CMP14+EGFR
A/G RATIO: 1.8 (ref 1.1–2.5)
ALT: 19 IU/L (ref 0–44)
AST: 17 IU/L (ref 0–40)
Albumin: 4.6 g/dL (ref 3.5–5.5)
Alkaline Phosphatase: 50 IU/L (ref 39–117)
BUN/Creatinine Ratio: 16 (ref 9–20)
BUN: 14 mg/dL (ref 6–24)
Bilirubin Total: 0.4 mg/dL (ref 0.0–1.2)
CALCIUM: 9.6 mg/dL (ref 8.7–10.2)
CO2: 22 mmol/L (ref 18–29)
Chloride: 99 mmol/L (ref 96–106)
Creatinine, Ser: 0.88 mg/dL (ref 0.76–1.27)
GFR, EST AFRICAN AMERICAN: 117 mL/min/{1.73_m2} (ref >59)
GFR, EST NON AFRICAN AMERICAN: 101 mL/min/{1.73_m2} (ref >59)
GLOBULIN, TOTAL: 2.5 g/dL (ref 1.5–4.5)
Glucose: 100 mg/dL — ABNORMAL HIGH (ref 65–99)
POTASSIUM: 4 mmol/L (ref 3.5–5.2)
Sodium: 139 mmol/L (ref 134–144)
TOTAL PROTEIN: 7.1 g/dL (ref 6.0–8.5)

## 2015-05-07 LAB — CBC WITH DIFFERENTIAL/PLATELET
BASOS: 0 %
Basophils Absolute: 0 10*3/uL (ref 0.0–0.2)
EOS (ABSOLUTE): 0 10*3/uL (ref 0.0–0.4)
EOS: 0 %
HEMATOCRIT: 42.9 % (ref 37.5–51.0)
HEMOGLOBIN: 14.3 g/dL (ref 12.6–17.7)
IMMATURE GRANS (ABS): 0 10*3/uL (ref 0.0–0.1)
IMMATURE GRANULOCYTES: 0 %
LYMPHS: 25 %
Lymphocytes Absolute: 2.1 10*3/uL (ref 0.7–3.1)
MCH: 30.4 pg (ref 26.6–33.0)
MCHC: 33.3 g/dL (ref 31.5–35.7)
MCV: 91 fL (ref 79–97)
Monocytes Absolute: 0.5 10*3/uL (ref 0.1–0.9)
Monocytes: 6 %
NEUTROS PCT: 69 %
Neutrophils Absolute: 5.8 10*3/uL (ref 1.4–7.0)
Platelets: 298 10*3/uL (ref 150–379)
RBC: 4.71 x10E6/uL (ref 4.14–5.80)
RDW: 14 % (ref 12.3–15.4)
WBC: 8.5 10*3/uL (ref 3.4–10.8)

## 2015-05-21 ENCOUNTER — Ambulatory Visit: Payer: Commercial Managed Care - HMO | Admitting: Family

## 2015-06-12 ENCOUNTER — Ambulatory Visit (INDEPENDENT_AMBULATORY_CARE_PROVIDER_SITE_OTHER): Payer: Commercial Managed Care - HMO | Admitting: Family Medicine

## 2015-06-12 ENCOUNTER — Encounter: Payer: Self-pay | Admitting: Family Medicine

## 2015-06-12 VITALS — BP 121/76 | HR 71 | Temp 97.5°F | Ht 67.0 in | Wt 215.6 lb

## 2015-06-12 DIAGNOSIS — Z024 Encounter for examination for driving license: Secondary | ICD-10-CM

## 2015-06-12 DIAGNOSIS — Z139 Encounter for screening, unspecified: Secondary | ICD-10-CM

## 2015-06-12 LAB — POCT URINALYSIS DIPSTICK
Bilirubin, UA: NEGATIVE
Blood, UA: NEGATIVE
Glucose, UA: NEGATIVE
KETONES UA: NEGATIVE
LEUKOCYTES UA: NEGATIVE
NITRITE UA: NEGATIVE
PH UA: 6
PROTEIN UA: NEGATIVE
Spec Grav, UA: 1.01
Urobilinogen, UA: NEGATIVE

## 2015-06-12 NOTE — Progress Notes (Signed)
Subjective:  Patient ID: Keith Blake, male    DOB: July 24, 1965  Age: 50 y.o. MRN: [id number]  CC: DOT PE   HPI Keith Blake presents for DOT Exam  History Keith Blake has a past medical history of Hypertension.   He has past surgical history that includes Umbilical hernia repair.   His family history is not on file.He reports that he has never smoked. He has never used smokeless tobacco. He reports that he drinks alcohol. He reports that he does not use illicit drugs.  Outpatient Prescriptions Prior to Visit  Medication Sig Dispense Refill  . bisoprolol-hydrochlorothiazide (ZIAC) 5-6.25 MG tablet Take 1 tablet by mouth daily. 90 tablet 0  . EPINEPHrine 0.3 mg/0.3 mL IJ SOAJ injection Inject 0.3 mLs (0.3 mg total) into the muscle once. 1 Device 2  . ciprofloxacin (CIPRO) 500 MG tablet Take 1 tablet (500 mg total) by mouth 2 (two) times daily. 40 tablet 0   No facility-administered medications prior to visit.    ROS Review of Systems  Constitutional: Negative for fever, chills, diaphoresis and unexpected weight change.  HENT: Negative for congestion, hearing loss, rhinorrhea and sore throat.   Eyes: Negative for visual disturbance.  Respiratory: Negative for cough and shortness of breath.   Cardiovascular: Negative for chest pain.  Gastrointestinal: Negative for abdominal pain, diarrhea and constipation.  Genitourinary: Negative for dysuria and flank pain.  Musculoskeletal: Negative for joint swelling and arthralgias.  Skin: Negative for rash.  Neurological: Negative for dizziness and headaches.  Psychiatric/Behavioral: Negative for sleep disturbance and dysphoric mood.    Objective:  BP 121/76 mmHg  Pulse 71  Temp(Src) 97.5 F (36.4 C) (Oral)  Ht  (1.702 m)  Wt 215 lb 9.6 oz (97.796 kg)  BMI 33.76 kg/m2  SpO2 98%  BP Readings from Last 3 Encounters:  06/12/15 121/76  05/06/15 131/78  04/05/15 143/87    Wt Readings from Last 3 Encounters:  06/12/15 215 lb  9.6 oz (97.796 kg)  05/06/15 215 lb (97.523 kg)  04/05/15 215 lb 6.4 oz (97.705 kg)     Physical Exam  Constitutional: He is oriented to person, place, and time. He appears well-developed and well-nourished.  HENT:  Head: Normocephalic and atraumatic.  Mouth/Throat: Oropharynx is clear and moist.  Eyes: EOM are normal. Pupils are equal, round, and reactive to light.  Neck: Normal range of motion. No tracheal deviation present. No thyromegaly present.  Cardiovascular: Normal rate, regular rhythm and normal heart sounds.  Exam reveals no gallop and no friction rub.   No murmur heard. Pulmonary/Chest: Breath sounds normal. He has no wheezes. He has no rales.  Abdominal: Soft. He exhibits no mass. There is no tenderness.  Musculoskeletal: Normal range of motion. He exhibits no edema.  Neurological: He is alert and oriented to person, place, and time.  Skin: Skin is warm and dry.  Psychiatric: He has a normal mood and affect.     Lab Results  Component Value Date   WBC 8.5 05/06/2015   HCT 42.9 05/06/2015   PLT 298 05/06/2015   GLUCOSE 100* 05/06/2015   ALT 19 05/06/2015   AST 17 05/06/2015   NA 139 05/06/2015   K 4.0 05/06/2015   CL 99 05/06/2015   CREATININE 0.88 05/06/2015   BUN 14 05/06/2015   CO2 22 05/06/2015    Patient was never admitted.  Assessment & Plan:   [name] was seen today for dot pe.  Diagnoses and all orders for this visit:  Screening -  POCT urinalysis dipstick  Encounter for Department of Transportation (DOT) examination for driving license renewal   I have discontinued Keith Blake's ciprofloxacin. I am also having him maintain his EPINEPHrine and bisoprolol-hydrochlorothiazide.  No orders of the defined types were placed in this encounter.     Follow-up: Return in about 1 year (around 06/11/2016).  Mechele Claude, M.D.

## 2015-07-12 ENCOUNTER — Other Ambulatory Visit: Payer: Self-pay | Admitting: Family

## 2015-08-16 ENCOUNTER — Encounter: Payer: Self-pay | Admitting: Family

## 2015-08-16 ENCOUNTER — Ambulatory Visit (INDEPENDENT_AMBULATORY_CARE_PROVIDER_SITE_OTHER): Payer: Commercial Managed Care - HMO | Admitting: Family

## 2015-08-16 VITALS — BP 136/87 | HR 72 | Temp 98.3°F | Ht 67.0 in | Wt 217.8 lb

## 2015-08-16 DIAGNOSIS — N4889 Other specified disorders of penis: Secondary | ICD-10-CM

## 2015-08-16 NOTE — Progress Notes (Signed)
   Subjective:    Patient ID: Keith Blake, male    DOB: January 13, 1966, 50 y.o.   MRN: [id number]  Penis Pain The patient's primary symptoms include genital lesions ("Only when I have sex") and penile pain. The patient's pertinent negatives include no genital injury, genital itching, penile discharge, scrotal swelling or testicular pain. This is a new problem. The current episode started more than 1 month ago. Episode frequency: Every time he has sex. The problem has been unchanged. Associated symptoms include painful intercourse and urgency. Pertinent negatives include no chest pain, chills, coughing, discolored urine, dysuria, frequency or hematuria. He has tried nothing for the symptoms. The treatment provided no relief. He is sexually active. It is unknown whether or not his partner has an STD. There is no history of chlamydia, gonorrhea or HIV.      Review of Systems  Constitutional: Negative.  Negative for chills.  HENT: Negative.   Respiratory: Negative.  Negative for cough.   Cardiovascular: Negative.  Negative for chest pain.  Gastrointestinal: Negative.   Endocrine: Negative.   Genitourinary: Positive for urgency and penile pain. Negative for dysuria, frequency, discharge, scrotal swelling and testicular pain.  Musculoskeletal: Negative.   Neurological: Negative.   Hematological: Negative.   Psychiatric/Behavioral: Negative.   All other systems reviewed and are negative.      Objective:   Physical Exam  Constitutional: He is oriented to person, place, and time. He appears well-developed and well-nourished. No distress.  HENT:  Head: Normocephalic.  Right Ear: External ear normal.  Left Ear: External ear normal.  Mouth/Throat: Oropharynx is clear and moist.  Eyes: Pupils are equal, round, and reactive to light. Right eye exhibits no discharge. Left eye exhibits no discharge.  Neck: Normal range of motion. Neck supple. No thyromegaly present.  Cardiovascular: Normal rate,  regular rhythm, normal heart sounds and intact distal pulses.   No murmur heard. Pulmonary/Chest: Effort normal and breath sounds normal. No respiratory distress. He has no wheezes.  Abdominal: Soft. Bowel sounds are normal. He exhibits no distension. There is no tenderness.  Genitourinary: Penile tenderness present.  No penis discharge or lesions present, tenderness present on shaft of pension   Musculoskeletal: Normal range of motion. He exhibits no edema or tenderness.  Neurological: He is alert and oriented to person, place, and time. He has normal reflexes. No cranial nerve deficit.  Skin: Skin is warm and dry. No rash noted. No erythema.  Psychiatric: He has a normal mood and affect. His behavior is normal. Judgment and thought content normal.  Vitals reviewed.   BP 136/87 mmHg  Pulse 72  Temp(Src) 98.3 F (36.8 C) (Oral)  Ht '5\' 7"'$  (1.702 m)  Wt 217 lb 12.8 oz (98.793 kg)  BMI 34.10 kg/m2       Assessment & Plan:  1. Penis pain -Safe sex discussed -If STD's negative could be related to peyronie's disease? -If continues may refer to urologists  -RTO prn  - Herpes simplex virus culture - GC/Chlamydia Probe Amp - STD Screen (8) - CMP14+EGFR  Evelina Dun, FNP

## 2015-08-16 NOTE — Patient Instructions (Signed)
Sexually Transmitted Disease °A sexually transmitted disease (STD) is a disease or infection that may be passed (transmitted) from person to person, usually during sexual activity. This may happen by way of saliva, semen, blood, vaginal mucus, or urine. Common STDs include: °· Gonorrhea. °· Chlamydia. °· Syphilis. °· HIV and AIDS. °· Genital herpes. °· Hepatitis B and C. °· Trichomonas. °· Human papillomavirus (HPV). °· Pubic lice. °· Scabies. °· Mites. °· Bacterial vaginosis. °WHAT ARE CAUSES OF STDs? °An STD may be caused by bacteria, a virus, or parasites. STDs are often transmitted during sexual activity if one person is infected. However, they may also be transmitted through nonsexual means. STDs may be transmitted after:  °· Sexual intercourse with an infected person. °· Sharing sex toys with an infected person. °· Sharing needles with an infected person or using unclean piercing or tattoo needles. °· Having intimate contact with the genitals, mouth, or rectal areas of an infected person. °· Exposure to infected fluids during birth. °WHAT ARE THE SIGNS AND SYMPTOMS OF STDs? °Different STDs have different symptoms. Some people may not have any symptoms. If symptoms are present, they may include: °· Painful or bloody urination. °· Pain in the pelvis, abdomen, vagina, anus, throat, or eyes. °· A skin rash, itching, or irritation. °· Growths, ulcerations, blisters, or sores in the genital and anal areas. °· Abnormal vaginal discharge with or without bad odor. °· Penile discharge in men. °· Fever. °· Pain or bleeding during sexual intercourse. °· Swollen glands in the groin area. °· Yellow skin and eyes (jaundice). This is seen with hepatitis. °· Swollen testicles. °· Infertility. °· Sores and blisters in the mouth. °HOW ARE STDs DIAGNOSED? °To make a diagnosis, your health care provider may: °· Take a medical history. °· Perform a physical exam. °· Take a sample of any discharge to examine. °· Swab the throat,  cervix, opening to the penis, rectum, or vagina for testing. °· Test a sample of your first morning urine. °· Perform blood tests. °· Perform a Pap test, if this applies. °· Perform a colposcopy. °· Perform a laparoscopy. °HOW ARE STDs TREATED? °Treatment depends on the STD. Some STDs may be treated but not cured. °· Chlamydia, gonorrhea, trichomonas, and syphilis can be cured with antibiotic medicine. °· Genital herpes, hepatitis, and HIV can be treated, but not cured, with prescribed medicines. The medicines lessen symptoms. °· Genital warts from HPV can be treated with medicine or by freezing, burning (electrocautery), or surgery. Warts may come back. °· HPV cannot be cured with medicine or surgery. However, abnormal areas may be removed from the cervix, vagina, or vulva. °· If your diagnosis is confirmed, your recent sexual partners need treatment. This is true even if they are symptom-free or have a negative culture or evaluation. They should not have sex until their health care providers say it is okay. °· Your health care provider may test you for infection again 3 months after treatment. °HOW CAN I REDUCE MY RISK OF GETTING AN STD? °Take these steps to reduce your risk of getting an STD: °· Use latex condoms, dental dams, and water-soluble lubricants during sexual activity. Do not use petroleum jelly or oils. °· Avoid having multiple sex partners. °· Do not have sex with someone who has other sex partners °· Do not have sex with anyone you do not know or who is at high risk for an STD. °· Avoid risky sex practices that can break your skin. °· Do not have sex   if you have open sores on your mouth or skin. °· Avoid drinking too much alcohol or taking illegal drugs. Alcohol and drugs can affect your judgment and put you in a vulnerable position. °· Avoid engaging in oral and anal sex acts. °· Get vaccinated for HPV and hepatitis. If you have not received these vaccines in the past, talk to your health care  provider about whether one or both might be right for you. °· If you are at risk of being infected with HIV, it is recommended that you take a prescription medicine daily to prevent HIV infection. This is called pre-exposure prophylaxis (PrEP). You are considered at risk if: °¨ You are a man who has sex with other men (MSM). °¨ You are a heterosexual man or woman and are sexually active with more than one partner. °¨ You take drugs by injection. °¨ You are sexually active with a partner who has HIV. °· Talk with your health care provider about whether you are at high risk of being infected with HIV. If you choose to begin PrEP, you should first be tested for HIV. You should then be tested every 3 months for as long as you are taking PrEP. °WHAT SHOULD I DO IF I THINK I HAVE AN STD? °· See your health care provider. °· Tell your sexual partner(s). They should be tested and treated for any STDs. °· Do not have sex until your health care provider says it is okay. °WHEN SHOULD I GET IMMEDIATE MEDICAL CARE? °Contact your health care provider right away if:  °· You have severe abdominal pain. °· You are a man and notice swelling or pain in your testicles. °· You are a woman and notice swelling or pain in your vagina. °  °This information is not intended to replace advice given to you by your health care provider. Make sure you discuss any questions you have with your health care provider. °  °Document Released: 08/01/2002 Document Revised: 06/01/2014 Document Reviewed: 11/29/2012 °Elsevier Interactive Patient Education ©2016 Elsevier Inc. ° °

## 2015-08-17 LAB — CMP14+EGFR
A/G RATIO: 2 (ref 1.2–2.2)
ALT: 18 IU/L (ref 0–44)
AST: 21 IU/L (ref 0–40)
Albumin: 4.5 g/dL (ref 3.5–5.5)
Alkaline Phosphatase: 51 IU/L (ref 39–117)
BILIRUBIN TOTAL: 0.4 mg/dL (ref 0.0–1.2)
BUN/Creatinine Ratio: 10 (ref 9–20)
BUN: 11 mg/dL (ref 6–24)
CALCIUM: 9.3 mg/dL (ref 8.7–10.2)
CO2: 22 mmol/L (ref 18–29)
Chloride: 99 mmol/L (ref 96–106)
Creatinine, Ser: 1.08 mg/dL (ref 0.76–1.27)
GFR, EST AFRICAN AMERICAN: 93 mL/min/{1.73_m2} (ref >59)
GFR, EST NON AFRICAN AMERICAN: 80 mL/min/{1.73_m2} (ref >59)
GLOBULIN, TOTAL: 2.3 g/dL (ref 1.5–4.5)
Glucose: 101 mg/dL — ABNORMAL HIGH (ref 65–99)
POTASSIUM: 3.6 mmol/L (ref 3.5–5.2)
SODIUM: 139 mmol/L (ref 134–144)
TOTAL PROTEIN: 6.8 g/dL (ref 6.0–8.5)

## 2015-08-17 LAB — STD SCREEN (8)
HEP B C IGM: NEGATIVE
HEP C VIRUS AB: 0.1 {s_co_ratio} (ref 0.0–0.9)
HIV Screen 4th Generation wRfx: NONREACTIVE
HSV 1 GLYCOPROTEIN G AB, IGG: 39.9 {index} — AB (ref 0.00–0.90)
Hep A IgM: NEGATIVE
Hepatitis B Surface Ag: NEGATIVE
RPR: NONREACTIVE

## 2015-08-19 LAB — GC/CHLAMYDIA PROBE AMP
CHLAMYDIA, DNA PROBE: NEGATIVE
NEISSERIA GONORRHOEAE BY PCR: NEGATIVE

## 2015-08-19 LAB — HERPES SIMPLEX VIRUS CULTURE

## 2015-09-03 ENCOUNTER — Telehealth: Payer: Self-pay | Admitting: Family Medicine

## 2015-09-03 DIAGNOSIS — N4889 Other specified disorders of penis: Secondary | ICD-10-CM

## 2015-09-04 NOTE — Telephone Encounter (Signed)
Tried to contact patient. No answer and no voicemail. Please advise

## 2015-09-05 NOTE — Telephone Encounter (Signed)
Referral to Urologists

## 2015-09-05 NOTE — Addendum Note (Signed)
Addended by: Jannifer RodneyHAWKS, Serria Sloma A on: 09/05/2015 08:24 AM   Modules accepted: Orders

## 2015-09-07 NOTE — Telephone Encounter (Signed)
Tried to contact patient to inform we are going to refer to Urology, no answer, no voicemail

## 2015-09-07 NOTE — Addendum Note (Signed)
Addended by: Quay BurowPOTTER, JANICE K on: 09/07/2015 09:15 AM   Modules accepted: Orders

## 2015-10-14 ENCOUNTER — Other Ambulatory Visit: Payer: Self-pay | Admitting: Family Medicine

## 2016-02-08 ENCOUNTER — Other Ambulatory Visit: Payer: Self-pay | Admitting: Family Medicine

## 2016-03-27 ENCOUNTER — Ambulatory Visit (INDEPENDENT_AMBULATORY_CARE_PROVIDER_SITE_OTHER): Payer: Commercial Managed Care - HMO | Admitting: Nurse Practitioner

## 2016-03-27 ENCOUNTER — Encounter: Payer: Self-pay | Admitting: Nurse Practitioner

## 2016-03-27 VITALS — BP 120/82 | HR 60 | Temp 98.6°F | Ht 67.0 in | Wt 217.0 lb

## 2016-03-27 DIAGNOSIS — M10021 Idiopathic gout, right elbow: Secondary | ICD-10-CM

## 2016-03-27 MED ORDER — ALLOPURINOL 100 MG PO TABS: 100.0000 mg | ORAL_TABLET | Freq: Every day | ORAL | 6 refills | Status: DC

## 2016-03-27 MED ORDER — COLCHICINE 0.6 MG PO TABS: ORAL_TABLET | ORAL | 0 refills | Status: DC

## 2016-03-27 MED ORDER — INDOMETHACIN 50 MG PO CAPS: 50.0000 mg | ORAL_CAPSULE | Freq: Two times a day (BID) | ORAL | 1 refills | Status: DC

## 2016-03-27 NOTE — Progress Notes (Addendum)
   Subjective:    Patient ID: Keith BanasJorge Seipp, male    DOB: January 09, 1966, 50 y.o.   MRN: [id number]  HPI Patient comes in c/o right elbow pain and swelling. Started about 4 days ago- hurts to move- denies any injury. He says that sometimes his big toe will swell and hurt and sometomes he knee swells and hurts. He has never been to doctor during these episodes.    Review of Systems  Constitutional: Negative for fever.  HENT: Negative.   Respiratory: Negative.   Cardiovascular: Negative.   Gastrointestinal: Negative.   Genitourinary: Negative.   Musculoskeletal: Positive for joint swelling (right elbow).  Neurological: Negative.   Psychiatric/Behavioral: Negative.   All other systems reviewed and are negative.      Objective:   Physical Exam  Constitutional: He is oriented to person, place, and time. He appears well-developed and well-nourished. No distress.  Cardiovascular: Normal rate, regular rhythm and normal heart sounds.   Pulmonary/Chest: Effort normal and breath sounds normal.  Neurological: He is alert and oriented to person, place, and time.  Skin: Skin is warm.  Right elbow pain on palpation- hot to touch.  Psychiatric: He has a normal mood and affect. His behavior is normal. Judgment and thought content normal.   BP 120/82   Pulse 60   Temp 98.6 F (37 C) (Oral)   Ht 5\' 7"  (1.702 m)   Wt 217 lb (98.4 kg)   BMI 33.99 kg/m         Assessment & Plan:  1. Acute idiopathic gout of right elbow Ice Low purine diet - colchicine 0.6 MG tablet; 1 tablet PO may repeat in 1 hour x1 if still hurting ( no more then 2 pills a day)  Dispense: 20 tablet; Refill: 0 - allopurinol (ZYLOPRIM) 100 MG tablet; Take 1 tablet (100 mg total) by mouth daily.  Dispense: 30 tablet; Refill: 6 - Arthritis Panel   Mary-Margaret Daphine DeutscherMartin, FNP  * Insurance would not pay for colchicine- changed to indocin 50mg  BID

## 2016-03-27 NOTE — Patient Instructions (Signed)
Low-Purine Diet Purines are compounds that affect the level of uric acid in your body. A low-purine diet is a diet that is low in purines. Eating a low-purine diet can prevent the level of uric acid in your body from getting too high and causing gout or kidney stones or both. WHAT DO I NEED TO KNOW ABOUT THIS DIET?  Choose low-purine foods. Examples of low-purine foods are listed in the next section.  Drink plenty of fluids, especially water. Fluids can help remove uric acid from your body. Try to drink 8-16 cups (1.9-3.8 L) a day.  Limit foods high in fat, especially saturated fat, as fat makes it harder for the body to get rid of uric acid. Foods high in saturated fat include pizza, cheese, ice cream, whole milk, fried foods, and gravies. Choose foods that are lower in fat and lean sources of protein. Use olive oil when cooking as it contains healthy fats that are not high in saturated fat.  Limit alcohol. Alcohol interferes with the elimination of uric acid from your body. If you are having a gout attack, avoid all alcohol.  Keep in mind that different people's bodies react differently to different foods. You will probably learn over time which foods do or do not affect you. If you discover that a food tends to cause your gout to flare up, avoid eating that food. You can more freely enjoy foods that do not cause problems. If you have any questions about a food item, talk to your dietitian or health care provider. WHICH FOODS ARE LOW, MODERATE, AND HIGH IN PURINES? The following is a list of foods that are low, moderate, and high in purines. You can eat any amount of the foods that are low in purines. You may be able to have small amounts of foods that are moderate in purines. Ask your health care provider how much of a food moderate in purines you can have. Avoid foods high in purines. Grains  Foods low in purines: Enriched white bread, pasta, rice, cake, cornbread, popcorn.  Foods moderate in  purines: Whole-grain breads and cereals, wheat germ, bran, oatmeal. Uncooked oatmeal. Dry wheat bran or wheat germ.  Foods high in purines: Pancakes, French toast, biscuits, muffins. Vegetables  Foods low in purines: All vegetables, except those that are moderate in purines.  Foods moderate in purines: Asparagus, cauliflower, spinach, mushrooms, green peas. Fruits  All fruits are low in purines. Meats and other Protein Foods  Foods low in purines: Eggs, nuts, peanut butter.  Foods moderate in purines: 80-90% lean beef, lamb, veal, pork, poultry, fish, eggs, peanut butter, nuts. Crab, lobster, oysters, and shrimp. Cooked dried beans, peas, and lentils.  Foods high in purines: Anchovies, sardines, herring, mussels, tuna, codfish, scallops, trout, and haddock. Bacon. Organ meats (such as liver or kidney). Tripe. Game meat. Goose. Sweetbreads. Dairy  All dairy foods are low in purines. Low-fat and fat-free dairy products are best because they are low in saturated fat. Beverages  Drinks low in purines: Water, carbonated beverages, tea, coffee, cocoa.  Drinks moderate in purines: Soft drinks and other drinks sweetened with high-fructose corn syrup. Juices. To find whether a food or drink is sweetened with high-fructose corn syrup, look at the ingredients list.  Drinks high in purines: Alcoholic beverages (such as beer). Condiments  Foods low in purines: Salt, herbs, olives, pickles, relishes, vinegar.  Foods moderate in purines: Butter, margarine, oils, mayonnaise. Fats and Oils  Foods low in purines: All types, except gravies   and sauces made with meat.  Foods high in purines: Gravies and sauces made with meat. Other Foods  Foods low in purines: Sugars, sweets, gelatin. Cake. Soups made without meat.  Foods moderate in purines: Meat-based or fish-based soups, broths, or bouillons. Foods and drinks sweetened with high-fructose corn syrup.  Foods high in purines: High-fat desserts  (such as ice cream, cookies, cakes, pies, doughnuts, and chocolate). Contact your dietitian for more information on foods that are not listed here.   This information is not intended to replace advice given to you by your health care provider. Make sure you discuss any questions you have with your health care provider.   Document Released: 09/05/2010 Document Revised: 05/16/2013 Document Reviewed: 04/17/2013 Elsevier Interactive Patient Education 2016 Elsevier Inc.  Gout Gout is when your joints become red, sore, and swell (inflamed). This is caused by the buildup of uric acid crystals in the joints. Uric acid is a chemical that is normally in the blood. If the level of uric acid gets too high in the blood, these crystals form in your joints and tissues. Over time, these crystals can form into masses near the joints and tissues. These masses can destroy bone and cause the bone to look misshapen (deformed). HOME CARE   Do not take aspirin for pain.  Only take medicine as told by your doctor.  Rest the joint as much as you can. When in bed, keep sheets and blankets off painful areas.  Keep the sore joints raised (elevated).  Put warm or cold packs on painful joints. Use of warm or cold packs depends on which works best for you.  Use crutches if the painful joint is in your leg.  Drink enough fluids to keep your pee (urine) clear or pale yellow. Limit alcohol, sugary drinks, and drinks with fructose in them.  Follow your diet instructions. Pay careful attention to how much protein you eat. Include fruits, vegetables, whole grains, and fat-free or low-fat milk products in your daily diet. Talk to your doctor or dietitian about the use of coffee, vitamin C, and cherries. These may help lower uric acid levels.  Keep a healthy body weight. GET HELP RIGHT AWAY IF:   You have watery poop (diarrhea), throw up (vomit), or have any side effects from medicines.  You do not feel better in 24  hours, or you are getting worse.  Your joint becomes suddenly more tender, and you have chills or a fever. MAKE SURE YOU:   Understand these instructions.  Will watch your condition.  Will get help right away if you are not doing well or get worse.   This information is not intended to replace advice given to you by your health care provider. Make sure you discuss any questions you have with your health care provider.   Document Released: 02/18/2008 Document Revised: 06/01/2014 Document Reviewed: 12/23/2011 Elsevier Interactive Patient Education 2016 Elsevier Inc.  

## 2016-03-27 NOTE — Addendum Note (Signed)
Addended by: Bennie PieriniMARTIN, MARY-MARGARET on: 03/27/2016 03:27 PM   Modules accepted: Orders

## 2016-03-28 LAB — ARTHRITIS PANEL
BASOS: 0 %
Basophils Absolute: 0 10*3/uL (ref 0.0–0.2)
EOS (ABSOLUTE): 0.1 10*3/uL (ref 0.0–0.4)
EOS: 2 %
HEMATOCRIT: 41.8 % (ref 37.5–51.0)
HEMOGLOBIN: 13.9 g/dL (ref 12.6–17.7)
IMMATURE GRANULOCYTES: 0 %
Immature Grans (Abs): 0 10*3/uL (ref 0.0–0.1)
LYMPHS ABS: 2.5 10*3/uL (ref 0.7–3.1)
Lymphs: 28 %
MCH: 30.1 pg (ref 26.6–33.0)
MCHC: 33.3 g/dL (ref 31.5–35.7)
MCV: 91 fL (ref 79–97)
MONOS ABS: 0.7 10*3/uL (ref 0.1–0.9)
Monocytes: 8 %
NEUTROS PCT: 62 %
Neutrophils Absolute: 5.4 10*3/uL (ref 1.4–7.0)
Platelets: 272 10*3/uL (ref 150–379)
RBC: 4.62 x10E6/uL (ref 4.14–5.80)
RDW: 14.8 % (ref 12.3–15.4)
RHEUMATOID FACTOR: 12.2 [IU]/mL (ref 0.0–13.9)
Sed Rate: 2 mm/hr (ref 0–30)
URIC ACID: 9 mg/dL — AB (ref 3.7–8.6)
WBC: 8.7 10*3/uL (ref 3.4–10.8)

## 2016-06-04 ENCOUNTER — Encounter: Payer: Self-pay | Admitting: Nurse Practitioner

## 2016-06-11 ENCOUNTER — Encounter: Payer: Self-pay | Admitting: *Deleted

## 2016-06-12 ENCOUNTER — Encounter: Payer: Self-pay | Admitting: Nurse Practitioner

## 2016-06-12 ENCOUNTER — Other Ambulatory Visit: Payer: Self-pay | Admitting: Nurse Practitioner

## 2016-06-12 ENCOUNTER — Other Ambulatory Visit: Payer: Self-pay | Admitting: Family Medicine

## 2016-06-12 ENCOUNTER — Ambulatory Visit (INDEPENDENT_AMBULATORY_CARE_PROVIDER_SITE_OTHER): Payer: Self-pay | Admitting: Nurse Practitioner

## 2016-06-12 ENCOUNTER — Telehealth: Payer: Self-pay | Admitting: Family

## 2016-06-12 VITALS — BP 127/82 | HR 82 | Ht 66.0 in | Wt 224.0 lb

## 2016-06-12 DIAGNOSIS — Z024 Encounter for examination for driving license: Secondary | ICD-10-CM

## 2016-06-12 LAB — URINALYSIS
BILIRUBIN UA: NEGATIVE
Glucose, UA: NEGATIVE
KETONES UA: NEGATIVE
Leukocytes, UA: NEGATIVE
Nitrite, UA: NEGATIVE
PROTEIN UA: NEGATIVE
RBC UA: NEGATIVE
SPEC GRAV UA: 1.01 (ref 1.005–1.030)
Urobilinogen, Ur: 0.2 mg/dL (ref 0.2–1.0)
pH, UA: 5.5 (ref 5.0–7.5)

## 2016-06-12 NOTE — Progress Notes (Signed)
Patient ID: Keith BanasJorge Blake, male   DOB: 05/26/1965, 51 y.o.   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

## 2016-06-15 ENCOUNTER — Ambulatory Visit: Payer: Commercial Managed Care - HMO | Admitting: Nurse Practitioner

## 2016-06-29 NOTE — Telephone Encounter (Signed)
Patient was scheduled for 06-12-2016 and did not keep appointment.

## 2016-09-30 ENCOUNTER — Other Ambulatory Visit: Payer: Self-pay

## 2016-09-30 DIAGNOSIS — M10021 Idiopathic gout, right elbow: Secondary | ICD-10-CM

## 2016-09-30 MED ORDER — ALLOPURINOL 100 MG PO TABS: 100.0000 mg | ORAL_TABLET | Freq: Every day | ORAL | 0 refills | Status: DC

## 2016-12-07 ENCOUNTER — Other Ambulatory Visit: Payer: Self-pay | Admitting: Family Medicine

## 2017-01-04 ENCOUNTER — Encounter: Payer: Self-pay | Admitting: Family Medicine

## 2017-01-04 ENCOUNTER — Ambulatory Visit (INDEPENDENT_AMBULATORY_CARE_PROVIDER_SITE_OTHER): Payer: 59

## 2017-01-04 ENCOUNTER — Ambulatory Visit (INDEPENDENT_AMBULATORY_CARE_PROVIDER_SITE_OTHER): Payer: 59 | Admitting: Family Medicine

## 2017-01-04 VITALS — BP 136/82 | HR 77 | Temp 97.1°F | Ht 66.0 in | Wt 222.4 lb

## 2017-01-04 DIAGNOSIS — J3089 Other allergic rhinitis: Secondary | ICD-10-CM | POA: Diagnosis not present

## 2017-01-04 DIAGNOSIS — R0600 Dyspnea, unspecified: Secondary | ICD-10-CM

## 2017-01-04 MED ORDER — MOMETASONE FUROATE 50 MCG/ACT NA SUSP: 2.0000 | Freq: Every day | NASAL | 12 refills | Status: DC

## 2017-01-04 MED ORDER — METHYLPREDNISOLONE ACETATE 80 MG/ML IJ SUSP
80.0000 mg | Freq: Once | INTRAMUSCULAR | Status: AC
  Administered 2017-01-04: 80 mg via INTRAMUSCULAR

## 2017-01-04 NOTE — Progress Notes (Signed)
   HPI  Patient presents today with nasal congestion.  Patient explains he had about 7 days of symptoms, over the last 3 days it's been more difficult to sleep. He complains of severe nasal congestion, he feels this is due to nasal allergies. He does have some shortness of breath but denies any fever, chills, sweats, or cough.  PMH: Smoking status noted ROS: Per HPI  Objective: BP 136/82   Pulse 77   Temp (!) 97.1 F (36.2 C) (Oral)   Ht 5\' 6"  (1.676 m)   Wt 222 lb 6.4 oz (100.9 kg)   SpO2 100%   BMI 35.90 kg/m  Gen: NAD, alert, cooperative with exam HEENT: NCAT, EOMI, PERRL nares with swollen turbinates bilaterally and boggy mucosa, oropharynx moist and clear with slight erythema CV: RRR, good S1/S2, no murmur Resp: CTABL, no wheezes, non-labored Ext: No edema, warm Neuro: Alert and oriented, No gross deficits  Assessment and plan:  # Seasonal allergic rhinitis Patient with moderate to severe symptoms today, given IM Depo-Medrol Continue Claritin, add nasal steroid, Nasonex sent.  With dyspnea and decided to go ahead and evaluate with chest x-ray as well to rule out underlying pneumonia. His exam is very reassuring.    Orders Placed This Encounter  Procedures  . DG Chest 2 View    Standing Status:   Future    Standing Expiration Date:   03/06/2018    Order Specific Question:   Reason for Exam (SYMPTOM  OR DIAGNOSIS REQUIRED)    Answer:   dyspnea    Order Specific Question:   Preferred imaging location?    Answer:   Internal    Order Specific Question:   Radiology Contrast Protocol - do NOT remove file path    Answer:   [email or web address]    Meds ordered this encounter  Medications  . mometasone (NASONEX) 50 MCG/ACT nasal spray    Sig: Place 2 sprays into the nose daily.    Dispense:  17 g    Refill:  12  . methylPREDNISolone acetate (DEPO-MEDROL) injection 80 mg    Murtis SinkSam Bradshaw, MD [facility] 01/04/2017, 1:54 PM

## 2017-01-04 NOTE — Patient Instructions (Addendum)
Great to see you!  Continue claritin 1 pill once daily, start nasonex 2 sprays per nostril once daily (flonase is good too if your insurance doesn't cover nasonex well)  Come back with any concerns

## 2017-03-05 ENCOUNTER — Other Ambulatory Visit: Payer: Self-pay | Admitting: Family Medicine

## 2017-03-08 ENCOUNTER — Other Ambulatory Visit: Payer: Self-pay | Admitting: Family Medicine

## 2017-03-08 DIAGNOSIS — M10021 Idiopathic gout, right elbow: Secondary | ICD-10-CM

## 2017-06-04 ENCOUNTER — Other Ambulatory Visit: Payer: Self-pay | Admitting: Family Medicine

## 2017-06-08 ENCOUNTER — Encounter: Payer: Self-pay | Admitting: Family Medicine

## 2017-06-08 ENCOUNTER — Ambulatory Visit (INDEPENDENT_AMBULATORY_CARE_PROVIDER_SITE_OTHER): Payer: Self-pay | Admitting: Family Medicine

## 2017-06-08 VITALS — BP 113/72 | HR 60 | Temp 97.5°F | Ht 67.0 in | Wt 234.0 lb

## 2017-06-08 DIAGNOSIS — I10 Essential (primary) hypertension: Secondary | ICD-10-CM | POA: Insufficient documentation

## 2017-06-08 DIAGNOSIS — Z024 Encounter for examination for driving license: Secondary | ICD-10-CM

## 2017-06-08 NOTE — Addendum Note (Signed)
Addended by: Margurite AuerbachOMPTON, KARLA G on: 06/08/2017 11:34 AM   Modules accepted: Orders

## 2017-06-08 NOTE — Progress Notes (Signed)
Subjective:  Patient ID: Keith Blake Musco, male    DOB: 08/09/1965  Age: 52 y.o. MRN: [id number]  CC: No chief complaint on file.   HPI Keith Blake Earhart presents for DOT medical exam  [facility] 01/04/2017 03/27/2016 06/12/2015  Decreased Interest 0 0 0  Down, Depressed, Hopeless 0 0 0  PHQ - 2 Score 0 0 0    History Donald PoreJorge has a past medical history of Hypertension.   He has a past surgical history that includes Umbilical hernia repair and hemorroid banding.   His family history is not on file.He reports that  has never smoked. he has never used smokeless tobacco. He reports that he drinks alcohol. He reports that he does not use drugs.    ROS Review of Systems  Constitutional: Negative for chills, diaphoresis, fever and unexpected weight change.  HENT: Negative for congestion, hearing loss, rhinorrhea and sore throat.   Eyes: Negative for visual disturbance.  Respiratory: Negative for cough and shortness of breath.   Cardiovascular: Negative for chest pain.  Gastrointestinal: Negative for abdominal pain, constipation and diarrhea.  Genitourinary: Negative for dysuria and flank pain.  Musculoskeletal: Negative for arthralgias and joint swelling.  Skin: Negative for rash.  Neurological: Negative for dizziness and headaches.  Psychiatric/Behavioral: Negative for dysphoric mood and sleep disturbance.    Objective:  There were no vitals taken for this visit.  BP Readings from Last 3 Encounters:  01/04/17 136/82  06/12/16 127/82  03/27/16 120/82    Wt Readings from Last 3 Encounters:  01/04/17 222 lb 6.4 oz (100.9 kg)  06/12/16 224 lb (101.6 kg)  03/27/16 217 lb (98.4 kg)     Physical Exam  Constitutional: He is oriented to person, place, and time. He appears well-developed and well-nourished. No distress.  HENT:  Head: Normocephalic and atraumatic.  Right Ear: External ear normal.  Left Ear: External ear normal.  Nose: Nose normal.  Mouth/Throat:  Oropharynx is clear and moist.  Eyes: Conjunctivae and EOM are normal. Pupils are equal, round, and reactive to light.  Neck: Normal range of motion. Neck supple. No thyromegaly present.  Cardiovascular: Normal rate, regular rhythm and normal heart sounds.  No murmur heard. Pulmonary/Chest: Effort normal and breath sounds normal. No respiratory distress. He has no wheezes. He has no rales.  Abdominal: Soft. Bowel sounds are normal. He exhibits no distension. There is no tenderness.  Lymphadenopathy:    He has no cervical adenopathy.  Neurological: He is alert and oriented to person, place, and time. He has normal reflexes.  Skin: Skin is warm and dry.  Psychiatric: He has a normal mood and affect. His behavior is normal. Judgment and thought content normal.      Assessment & Plan:   Diagnoses and all orders for this visit:  Encounter for Department of Transportation (DOT) examination for driving license renewal       I am having Keith Blake Hershey maintain his EPINEPHrine, mometasone, allopurinol, and bisoprolol-hydrochlorothiazide.  Allergies as of 06/08/2017      Reactions   Penicillins Rash   hives      Medication List        Accurate as of 06/08/17 11:13 AM. Always use your most recent med list.          allopurinol 100 MG tablet Commonly known as:  ZYLOPRIM TAKE 1 TABLET BY MOUTH EVERY DAY   bisoprolol-hydrochlorothiazide 5-6.25 MG tablet Commonly known as:  ZIAC TAKE 1 TABLET BY MOUTH DAILY.   EPINEPHrine 0.3  mg/0.3 mL Soaj injection Commonly known as:  EPI-PEN Inject 0.3 mLs (0.3 mg total) into the muscle once.   mometasone 50 MCG/ACT nasal spray Commonly known as:  NASONEX Place 2 sprays into the nose daily.        Follow-up: Return in about 1 year (around 06/08/2018).  Mechele Claude, M.D.

## 2017-06-15 ENCOUNTER — Other Ambulatory Visit: Payer: Self-pay | Admitting: *Deleted

## 2017-06-15 DIAGNOSIS — M10021 Idiopathic gout, right elbow: Secondary | ICD-10-CM

## 2017-06-15 MED ORDER — ALLOPURINOL 100 MG PO TABS: 100.0000 mg | ORAL_TABLET | Freq: Every day | ORAL | 0 refills | Status: DC

## 2017-06-17 MED ORDER — ALLOPURINOL 100 MG PO TABS: 100.0000 mg | ORAL_TABLET | Freq: Every day | ORAL | 0 refills | Status: DC

## 2017-06-17 NOTE — Addendum Note (Signed)
Addended by: Julious PayerHOLT, CATHERINE D on: 06/17/2017 08:28 AM   Modules accepted: Orders

## 2017-06-25 ENCOUNTER — Ambulatory Visit (INDEPENDENT_AMBULATORY_CARE_PROVIDER_SITE_OTHER): Payer: BLUE CROSS/BLUE SHIELD | Admitting: Family Medicine

## 2017-06-25 ENCOUNTER — Encounter: Payer: Self-pay | Admitting: Family Medicine

## 2017-06-25 VITALS — BP 129/79 | HR 64 | Temp 97.7°F | Ht 67.0 in | Wt 231.0 lb

## 2017-06-25 DIAGNOSIS — L509 Urticaria, unspecified: Secondary | ICD-10-CM

## 2017-06-25 DIAGNOSIS — N410 Acute prostatitis: Secondary | ICD-10-CM | POA: Diagnosis not present

## 2017-06-25 DIAGNOSIS — Z13220 Encounter for screening for lipoid disorders: Secondary | ICD-10-CM | POA: Diagnosis not present

## 2017-06-25 DIAGNOSIS — N509 Disorder of male genital organs, unspecified: Secondary | ICD-10-CM

## 2017-06-25 DIAGNOSIS — N50819 Testicular pain, unspecified: Secondary | ICD-10-CM

## 2017-06-25 DIAGNOSIS — Z0000 Encounter for general adult medical examination without abnormal findings: Secondary | ICD-10-CM

## 2017-06-25 DIAGNOSIS — I10 Essential (primary) hypertension: Secondary | ICD-10-CM | POA: Diagnosis not present

## 2017-06-25 LAB — URINALYSIS
Bilirubin, UA: NEGATIVE
Glucose, UA: NEGATIVE
Ketones, UA: NEGATIVE
LEUKOCYTES UA: NEGATIVE
Nitrite, UA: NEGATIVE
Protein, UA: NEGATIVE
RBC, UA: NEGATIVE
SPEC GRAV UA: 1.02 (ref 1.005–1.030)
Urobilinogen, Ur: 0.2 mg/dL (ref 0.2–1.0)
pH, UA: 6 (ref 5.0–7.5)

## 2017-06-25 MED ORDER — DOXYCYCLINE HYCLATE 100 MG PO TABS: 100.0000 mg | ORAL_TABLET | Freq: Two times a day (BID) | ORAL | 0 refills | Status: DC

## 2017-06-25 NOTE — Progress Notes (Signed)
Subjective:  Patient ID: Keith Blake, male    DOB: May 13, 1966  Age: 52 y.o. MRN: [id number]  CC: Annual Exam (pt here today for CPE and also c/o "pressure under his testicles")   HPI Keith Blake presents for complete physical.  Also having some pain in the perineal area which she describes as a pressure.  Depression screen Keith Blake 2/9 06/25/2017 06/08/2017 01/04/2017  Decreased Interest 0 0 0  Down, Depressed, Hopeless 0 0 0  PHQ - 2 Score 0 0 0    History Keith Blake has a past medical history of Hypertension.   He has a past surgical history that includes Umbilical hernia repair and hemorroid banding.   His family history is not on file.He reports that  has never smoked. he has never used smokeless tobacco. He reports that he drinks alcohol. He reports that he does not use drugs.    ROS Review of Systems  Constitutional: Negative for activity change, appetite change, chills, diaphoresis, fatigue, fever and unexpected weight change.  HENT: Negative for congestion, ear pain, hearing loss, postnasal drip, rhinorrhea, sore throat, tinnitus and trouble swallowing.   Eyes: Negative for photophobia, pain, discharge and redness.  Respiratory: Negative for apnea, cough, choking, chest tightness, shortness of breath, wheezing and stridor.   Cardiovascular: Negative for chest pain, palpitations and leg swelling.  Gastrointestinal: Negative for abdominal distention, abdominal pain, blood in stool, constipation, diarrhea, nausea and vomiting.  Endocrine: Negative for cold intolerance, heat intolerance, polydipsia, polyphagia and polyuria.  Genitourinary: Positive for dysuria. Negative for difficulty urinating, enuresis, flank pain, frequency, genital sores, hematuria, scrotal swelling, testicular pain and urgency.       Perineal pain is fairly constant as a dull ache rated 3-4/10.  Patient is worried about the cause as well as what to do for it.  There is some increased with bowel movements.  There  is no pain when he has sex.  He is not having dysuria.  Musculoskeletal: Negative for arthralgias and joint swelling.  Skin: Negative for color change, rash and wound.  Allergic/Immunologic: Negative for immunocompromised state.  Neurological: Negative for dizziness, tremors, seizures, syncope, facial asymmetry, speech difficulty, weakness, light-headedness, numbness and headaches.  Hematological: Does not bruise/bleed easily.  Psychiatric/Behavioral: Negative for agitation, behavioral problems, confusion, decreased concentration, dysphoric mood, hallucinations, sleep disturbance and suicidal ideas. The patient is not nervous/anxious and is not hyperactive.     Objective:  BP 129/79   Pulse 64   Temp 97.7 F (36.5 C) (Oral)   Ht 5' 7" (1.702 m)   Wt 231 lb (104.8 kg)   BMI 36.18 kg/m   BP Readings from Last 3 Encounters:  06/25/17 129/79  06/08/17 113/72  01/04/17 136/82    Wt Readings from Last 3 Encounters:  06/25/17 231 lb (104.8 kg)  06/08/17 234 lb (106.1 kg)  01/04/17 222 lb 6.4 oz (100.9 kg)     Physical Exam  Constitutional: He is oriented to person, place, and time. He appears well-developed and well-nourished.  HENT:  Head: Normocephalic and atraumatic.  Mouth/Throat: Oropharynx is clear and moist.  Eyes: EOM are normal. Pupils are equal, round, and reactive to light.  Neck: Normal range of motion. No tracheal deviation present. No thyromegaly present.  Cardiovascular: Normal rate, regular rhythm and normal heart sounds. Exam reveals no gallop and no friction rub.  No murmur heard. Pulmonary/Chest: Breath sounds normal. He has no wheezes. He has no rales.  Abdominal: Soft. He exhibits no mass. There is no tenderness. Hernia confirmed  negative in the right inguinal area and confirmed negative in the left inguinal area.  Genitourinary: Rectum normal, testes normal and penis normal. Prostate is enlarged (Boggy and swollen) and tender. Cremasteric reflex is present.  Uncircumcised. No penile erythema.  Musculoskeletal: Normal range of motion. He exhibits no edema.  Neurological: He is alert and oriented to person, place, and time.  Skin: Skin is warm and dry.  Psychiatric: He has a normal mood and affect.      Assessment & Plan:   Ramondo was seen today for annual exam.  Diagnoses and all orders for this visit:  Essential hypertension -     CBC with Differential/Platelet -     CMP14+EGFR  Well adult exam -     CBC with Differential/Platelet -     CMP14+EGFR -     Lipid panel -     PSA, total and free -     Urinalysis -     Uric acid  Testicle tenderness -     Urine Culture -     Urinalysis  Screening for cholesterol level -     Lipid panel  Acute prostatitis -     CBC with Differential/Platelet -     Urine Culture -     Urinalysis -     doxycycline (VIBRA-TABS) 100 MG tablet; Take 1 tablet (100 mg total) by mouth 2 (two) times daily.  Urticaria       I am having Keith Blake start on doxycycline. I am also having him maintain his EPINEPHrine, bisoprolol-hydrochlorothiazide, and allopurinol.  Allergies as of 06/25/2017      Reactions   Penicillins Rash   hives      Medication List        Accurate as of 06/25/17 11:59 PM. Always use your most recent med list.          allopurinol 100 MG tablet Commonly known as:  ZYLOPRIM Take 1 tablet (100 mg total) by mouth daily.   bisoprolol-hydrochlorothiazide 5-6.25 MG tablet Commonly known as:  ZIAC TAKE 1 TABLET BY MOUTH DAILY.   doxycycline 100 MG tablet Commonly known as:  VIBRA-TABS Take 1 tablet (100 mg total) by mouth 2 (two) times daily.   EPINEPHrine 0.3 mg/0.3 mL Soaj injection Commonly known as:  EPI-PEN Inject 0.3 mLs (0.3 mg total) into the muscle once.        Follow-up: No Follow-up on file.  Keith Blake, M.D.

## 2017-06-26 LAB — CBC WITH DIFFERENTIAL/PLATELET
Basophils Absolute: 0 10*3/uL (ref 0.0–0.2)
Basos: 0 %
EOS (ABSOLUTE): 0.2 10*3/uL (ref 0.0–0.4)
EOS: 3 %
HEMATOCRIT: 44 % (ref 37.5–51.0)
HEMOGLOBIN: 15 g/dL (ref 13.0–17.7)
Immature Grans (Abs): 0 10*3/uL (ref 0.0–0.1)
Immature Granulocytes: 0 %
LYMPHS ABS: 1.9 10*3/uL (ref 0.7–3.1)
Lymphs: 32 %
MCH: 30.2 pg (ref 26.6–33.0)
MCHC: 34.1 g/dL (ref 31.5–35.7)
MCV: 89 fL (ref 79–97)
MONOCYTES: 7 %
MONOS ABS: 0.4 10*3/uL (ref 0.1–0.9)
NEUTROS ABS: 3.5 10*3/uL (ref 1.4–7.0)
Neutrophils: 58 %
Platelets: 280 10*3/uL (ref 150–379)
RBC: 4.96 x10E6/uL (ref 4.14–5.80)
RDW: 13.4 % (ref 12.3–15.4)
WBC: 6 10*3/uL (ref 3.4–10.8)

## 2017-06-26 LAB — URINE CULTURE

## 2017-06-26 LAB — CMP14+EGFR
A/G RATIO: 1.8 (ref 1.2–2.2)
ALBUMIN: 4.6 g/dL (ref 3.5–5.5)
ALK PHOS: 64 IU/L (ref 39–117)
ALT: 55 IU/L — ABNORMAL HIGH (ref 0–44)
AST: 47 IU/L — ABNORMAL HIGH (ref 0–40)
BILIRUBIN TOTAL: 0.6 mg/dL (ref 0.0–1.2)
BUN/Creatinine Ratio: 11 (ref 9–20)
BUN: 9 mg/dL (ref 6–24)
CHLORIDE: 100 mmol/L (ref 96–106)
CO2: 26 mmol/L (ref 20–29)
Calcium: 9.9 mg/dL (ref 8.7–10.2)
Creatinine, Ser: 0.83 mg/dL (ref 0.76–1.27)
GFR calc Af Amer: 118 mL/min/{1.73_m2} (ref >59)
GFR calc non Af Amer: 102 mL/min/{1.73_m2} (ref >59)
GLOBULIN, TOTAL: 2.5 g/dL (ref 1.5–4.5)
Glucose: 102 mg/dL — ABNORMAL HIGH (ref 65–99)
POTASSIUM: 4 mmol/L (ref 3.5–5.2)
SODIUM: 141 mmol/L (ref 134–144)
Total Protein: 7.1 g/dL (ref 6.0–8.5)

## 2017-06-26 LAB — PSA, TOTAL AND FREE
PROSTATE SPECIFIC AG, SERUM: 0.2 ng/mL (ref 0.0–4.0)
PSA FREE PCT: 55 %
PSA FREE: 0.11 ng/mL

## 2017-06-26 LAB — LIPID PANEL
CHOL/HDL RATIO: 3.8 ratio (ref 0.0–5.0)
CHOLESTEROL TOTAL: 182 mg/dL (ref 100–199)
HDL: 48 mg/dL (ref >39)
LDL CALC: 110 mg/dL — AB (ref 0–99)
Triglycerides: 121 mg/dL (ref 0–149)
VLDL CHOLESTEROL CAL: 24 mg/dL (ref 5–40)

## 2017-06-26 LAB — URIC ACID: URIC ACID: 6.6 mg/dL (ref 3.7–8.6)

## 2017-08-03 ENCOUNTER — Ambulatory Visit: Payer: BLUE CROSS/BLUE SHIELD | Admitting: Family Medicine

## 2017-08-03 ENCOUNTER — Encounter: Payer: Self-pay | Admitting: Family Medicine

## 2017-08-03 VITALS — BP 111/65 | HR 63 | Temp 97.6°F | Ht 67.0 in | Wt 234.0 lb

## 2017-08-03 DIAGNOSIS — K279 Peptic ulcer, site unspecified, unspecified as acute or chronic, without hemorrhage or perforation: Secondary | ICD-10-CM | POA: Diagnosis not present

## 2017-08-03 MED ORDER — PANTOPRAZOLE SODIUM 40 MG PO TBEC: 40.0000 mg | DELAYED_RELEASE_TABLET | Freq: Every day | ORAL | 3 refills | Status: DC

## 2017-08-03 NOTE — Progress Notes (Signed)
Subjective:  Patient ID: Keith Blake, male    DOB: 1965-07-16  Age: 52 y.o. MRN: [id number]  CC: stomach burning   HPI [name] presents for onset 3-4 months ago of mild intermittent heartburn and indigestion.  It has been located in the epigastrium.  It is described as a burning.  It has been increasing in frequency and severity until about a week ago he says it became constant.  It increases with intake of spicy foods and soft drinks.  Depression screen [facility] 2/9 06/25/2017 06/08/2017 01/04/2017  Decreased Interest 0 0 0  Down, Depressed, Hopeless 0 0 0  PHQ - 2 Score 0 0 0    History Keith Blake has a past medical history of Hypertension.   He has a past surgical history that includes Umbilical hernia repair and hemorroid banding.   His family history is not on file.He reports that  has never smoked. he has never used smokeless tobacco. He reports that he drinks alcohol. He reports that he does not use drugs.    ROS Review of Systems  Constitutional: Negative for chills, diaphoresis, fever and unexpected weight change.  HENT: Negative for rhinorrhea and trouble swallowing.   Respiratory: Negative for cough, chest tightness and shortness of breath.   Cardiovascular: Negative for chest pain.  Gastrointestinal: Positive for abdominal pain. Negative for abdominal distention, blood in stool, constipation, diarrhea, nausea, rectal pain and vomiting.  Genitourinary: Negative for dysuria, flank pain and hematuria.  Musculoskeletal: Negative for arthralgias and joint swelling.  Skin: Negative for rash.  Neurological: Negative for syncope and headaches.    Objective:  BP 111/65   Pulse 63   Temp 97.6 F (36.4 C) (Oral)   Ht _0  (1.702 m)   Wt 234 lb (106.1 kg)   BMI 36.65 kg/m   BP Readings from Last 3 Encounters:  08/03/17 111/65  06/25/17 129/79  06/08/17 113/72    Wt Readings from Last 3 Encounters:  08/03/17 234 lb (106.1 kg)  06/25/17 231 lb (104.8 kg)  06/08/17  234 lb (106.1 kg)     Physical Exam  Constitutional: He is oriented to person, place, and time. He appears well-developed and well-nourished.  HENT:  Head: Normocephalic and atraumatic.  Right Ear: Tympanic membrane and external ear normal. No decreased hearing is noted.  Left Ear: Tympanic membrane and external ear normal. No decreased hearing is noted.  Mouth/Throat: No oropharyngeal exudate or posterior oropharyngeal erythema.  Eyes: Pupils are equal, round, and reactive to light.  Neck: Normal range of motion. Neck supple.  Cardiovascular: Normal rate and regular rhythm.  No murmur heard. Pulmonary/Chest: Breath sounds normal. No respiratory distress.  Abdominal: Soft. Bowel sounds are normal. He exhibits no distension and no mass. There is tenderness (Moderate epigastric). There is guarding. There is no rebound.  Musculoskeletal: Normal range of motion.  Neurological: He is alert and oriented to person, place, and time.  Skin: Skin is warm and dry. No erythema.  Psychiatric: He has a normal mood and affect.  Vitals reviewed.     Assessment & Plan:   Berley was seen today for stomach burning.  Diagnoses and all orders for this visit:  PUD (peptic ulcer disease) -     CBC with Differential/Platelet -     CMP14+EGFR -     H. pylori antigen, stool  Other orders -     pantoprazole (PROTONIX) 40 MG tablet; Take 1 tablet (40 mg total) by mouth daily.  I have discontinued Braidyn Manso's doxycycline. I am also having him start on pantoprazole. Additionally, I am having him maintain his EPINEPHrine, bisoprolol-hydrochlorothiazide, and allopurinol.  Allergies as of 08/03/2017      Reactions   Penicillins Rash   hives      Medication List        Accurate as of 08/03/17  5:35 PM. Always use your most recent med list.          allopurinol 100 MG tablet Commonly known as:  ZYLOPRIM Take 1 tablet (100 mg total) by mouth daily.   bisoprolol-hydrochlorothiazide  5-6.25 MG tablet Commonly known as:  ZIAC TAKE 1 TABLET BY MOUTH DAILY.   EPINEPHrine 0.3 mg/0.3 mL Soaj injection Commonly known as:  EPI-PEN Inject 0.3 mLs (0.3 mg total) into the muscle once.   pantoprazole 40 MG tablet Commonly known as:  PROTONIX Take 1 tablet (40 mg total) by mouth daily.        Follow-up: Return in about 3 months (around 11/03/2017), or if symptoms worsen or fail to improve.  Keith Blake, M.D.

## 2017-08-04 ENCOUNTER — Other Ambulatory Visit: Payer: BLUE CROSS/BLUE SHIELD

## 2017-08-04 DIAGNOSIS — K279 Peptic ulcer, site unspecified, unspecified as acute or chronic, without hemorrhage or perforation: Secondary | ICD-10-CM | POA: Diagnosis not present

## 2017-08-04 LAB — CBC WITH DIFFERENTIAL/PLATELET
BASOS: 0 %
Basophils Absolute: 0 10*3/uL (ref 0.0–0.2)
EOS (ABSOLUTE): 0.2 10*3/uL (ref 0.0–0.4)
EOS: 4 %
HEMATOCRIT: 42 % (ref 37.5–51.0)
Hemoglobin: 14 g/dL (ref 13.0–17.7)
IMMATURE GRANS (ABS): 0 10*3/uL (ref 0.0–0.1)
IMMATURE GRANULOCYTES: 0 %
Lymphocytes Absolute: 2.5 10*3/uL (ref 0.7–3.1)
Lymphs: 38 %
MCH: 30.1 pg (ref 26.6–33.0)
MCHC: 33.3 g/dL (ref 31.5–35.7)
MCV: 90 fL (ref 79–97)
MONOCYTES: 9 %
Monocytes Absolute: 0.6 10*3/uL (ref 0.1–0.9)
NEUTROS PCT: 49 %
Neutrophils Absolute: 3.2 10*3/uL (ref 1.4–7.0)
Platelets: 288 10*3/uL (ref 150–379)
RBC: 4.65 x10E6/uL (ref 4.14–5.80)
RDW: 13.5 % (ref 12.3–15.4)
WBC: 6.6 10*3/uL (ref 3.4–10.8)

## 2017-08-04 LAB — CMP14+EGFR
A/G RATIO: 1.9 (ref 1.2–2.2)
ALT: 27 IU/L (ref 0–44)
AST: 23 IU/L (ref 0–40)
Albumin: 4.4 g/dL (ref 3.5–5.5)
Alkaline Phosphatase: 58 IU/L (ref 39–117)
BUN/Creatinine Ratio: 11 (ref 9–20)
BUN: 10 mg/dL (ref 6–24)
Bilirubin Total: 0.4 mg/dL (ref 0.0–1.2)
CALCIUM: 9.7 mg/dL (ref 8.7–10.2)
CO2: 23 mmol/L (ref 20–29)
CREATININE: 0.91 mg/dL (ref 0.76–1.27)
Chloride: 102 mmol/L (ref 96–106)
GFR, EST AFRICAN AMERICAN: 112 mL/min/{1.73_m2} (ref >59)
GFR, EST NON AFRICAN AMERICAN: 97 mL/min/{1.73_m2} (ref >59)
GLOBULIN, TOTAL: 2.3 g/dL (ref 1.5–4.5)
Glucose: 89 mg/dL (ref 65–99)
POTASSIUM: 4.1 mmol/L (ref 3.5–5.2)
SODIUM: 140 mmol/L (ref 134–144)
TOTAL PROTEIN: 6.7 g/dL (ref 6.0–8.5)

## 2017-08-05 LAB — H. PYLORI ANTIGEN, STOOL: H pylori Ag, Stl: NEGATIVE

## 2017-08-26 ENCOUNTER — Encounter: Payer: Self-pay | Admitting: *Deleted

## 2017-08-28 ENCOUNTER — Other Ambulatory Visit: Payer: Self-pay | Admitting: Family Medicine

## 2017-09-08 ENCOUNTER — Other Ambulatory Visit: Payer: Self-pay | Admitting: Family Medicine

## 2017-09-08 DIAGNOSIS — M10021 Idiopathic gout, right elbow: Secondary | ICD-10-CM

## 2017-09-29 IMAGING — DX DG CHEST 2V
2 series · 2 of 2 positions shown · non-contrast
Comparison: None in PACs

CLINICAL DATA: Shortness of breath common nonsmoker.

EXAM:
CHEST  2 VIEW

[chest pa]
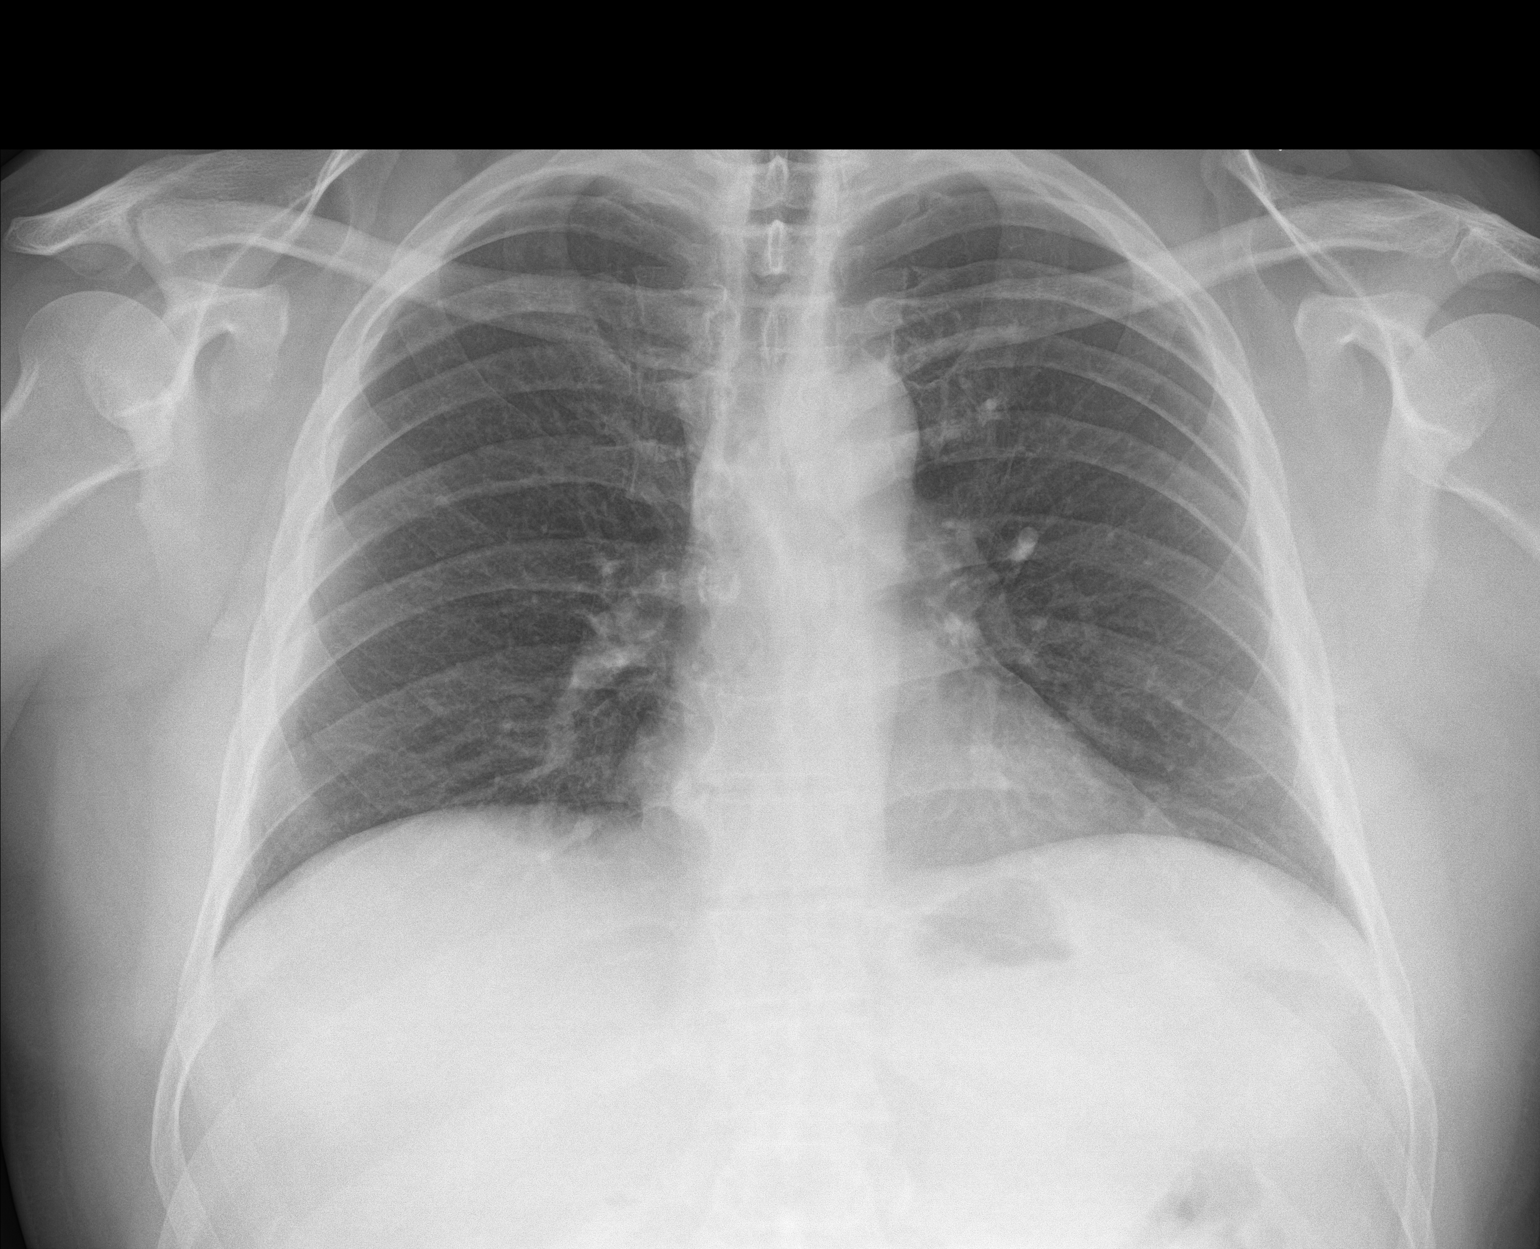

[chest lat]
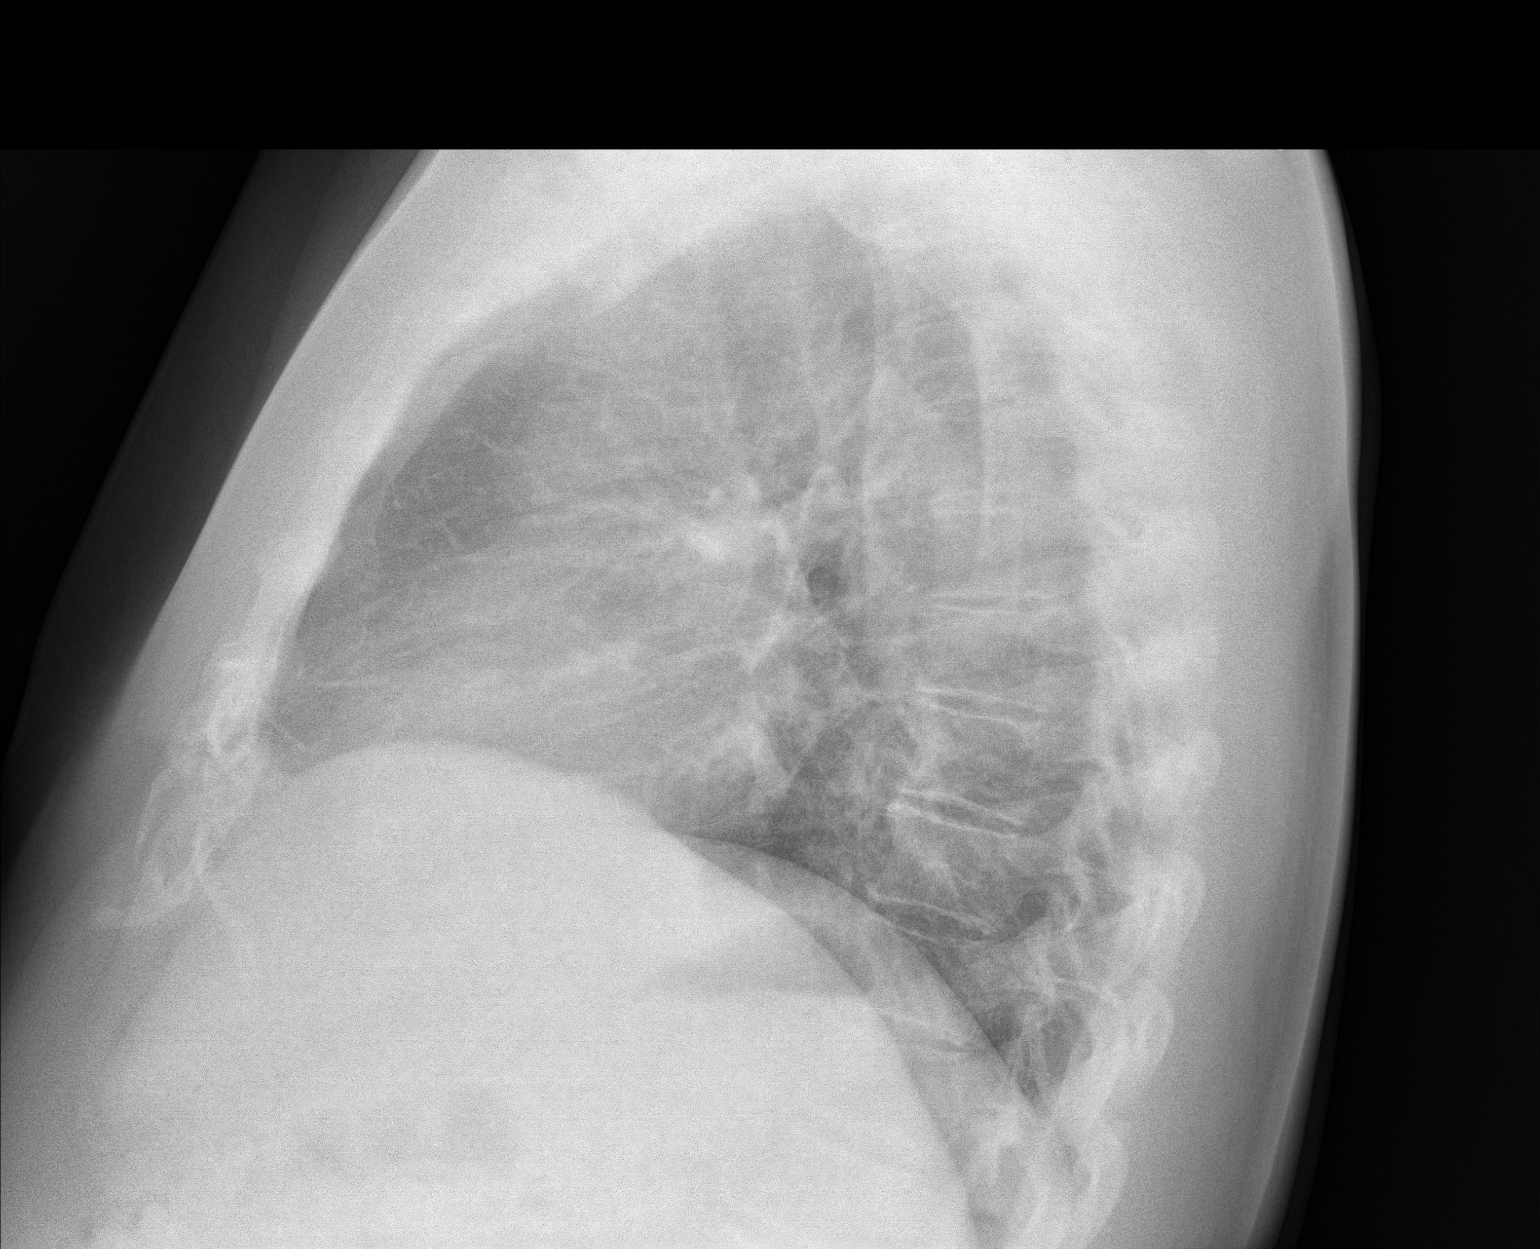

[2 of 2 positions shown; findings below may reference images not displayed]

FINDINGS: The lungs are adequately inflated. There is no focal infiltrate.
There is no pleural effusion. The heart and pulmonary vascularity
are normal. The mediastinum is normal in width. The bony thorax
exhibits no acute abnormality.
IMPRESSION: There is no acute cardiopulmonary abnormality.

## 2017-12-03 ENCOUNTER — Other Ambulatory Visit: Payer: Self-pay | Admitting: Family Medicine

## 2017-12-24 ENCOUNTER — Ambulatory Visit: Payer: BLUE CROSS/BLUE SHIELD | Admitting: Family Medicine

## 2017-12-28 ENCOUNTER — Ambulatory Visit: Payer: BLUE CROSS/BLUE SHIELD | Admitting: Family Medicine

## 2018-01-17 ENCOUNTER — Ambulatory Visit: Payer: BLUE CROSS/BLUE SHIELD | Admitting: Family

## 2018-01-17 ENCOUNTER — Encounter: Payer: Self-pay | Admitting: Family

## 2018-01-17 VITALS — BP 135/86 | HR 65 | Temp 98.3°F | Ht 67.0 in | Wt 236.8 lb

## 2018-01-17 DIAGNOSIS — W57XXXA Bitten or stung by nonvenomous insect and other nonvenomous arthropods, initial encounter: Secondary | ICD-10-CM | POA: Diagnosis not present

## 2018-01-17 DIAGNOSIS — S20369A Insect bite (nonvenomous) of unspecified front wall of thorax, initial encounter: Secondary | ICD-10-CM

## 2018-01-17 MED ORDER — DOXYCYCLINE HYCLATE 100 MG PO TABS: 100.0000 mg | ORAL_TABLET | Freq: Two times a day (BID) | ORAL | 0 refills | Status: DC

## 2018-01-17 MED ORDER — TRIAMCINOLONE ACETONIDE 0.5 % EX OINT: 1.0000 "application " | TOPICAL_OINTMENT | Freq: Two times a day (BID) | CUTANEOUS | 0 refills | Status: DC

## 2018-01-17 NOTE — Progress Notes (Signed)
   Subjective:    Patient ID: Keith Blake, male    DOB: 1965-10-06, 52 y.o.   MRN: [id number]  Chief Complaint  Patient presents with  . insect bites    Rash  This is a new problem. The current episode started in the past 7 days. The problem has been gradually worsening since onset. The affected locations include the chest, left arm and right arm. The rash is characterized by itchiness, redness and blistering. It is unknown if there was an exposure to a precipitant. Pertinent negatives include no congestion, cough, diarrhea, eye pain, fever, shortness of breath or sore throat. Past treatments include anti-itch cream. The treatment provided mild relief.      Review of Systems  Constitutional: Negative for fever.  HENT: Negative for congestion and sore throat.   Eyes: Negative for pain.  Respiratory: Negative for cough and shortness of breath.   Gastrointestinal: Negative for diarrhea.  Skin: Positive for rash.  All other systems reviewed and are negative.      Objective:   Physical Exam  Constitutional: He is oriented to person, place, and time. He appears well-developed and well-nourished. No distress.  HENT:  Head: Normocephalic.  Right Ear: External ear normal.  Left Ear: External ear normal.  Mouth/Throat: Oropharynx is clear and moist.  Eyes: Pupils are equal, round, and reactive to light. Right eye exhibits no discharge. Left eye exhibits no discharge.  Neck: Normal range of motion. Neck supple. No thyromegaly present.  Cardiovascular: Normal rate, regular rhythm, normal heart sounds and intact distal pulses.  No murmur heard. Pulmonary/Chest: Effort normal and breath sounds normal. No respiratory distress. He has no wheezes.  Abdominal: Soft. Bowel sounds are normal. He exhibits no distension. There is no tenderness.  Musculoskeletal: Normal range of motion. He exhibits no edema or tenderness.  Neurological: He is alert and oriented to person, place, and time. He has  normal reflexes. No cranial nerve deficit.  Skin: Skin is warm and dry. No rash noted. No erythema.  5.3X5Cm erythemas macule on chest, 1 small papule on right and left arm   Psychiatric: He has a normal mood and affect. His behavior is normal. Judgment and thought content normal.  Vitals reviewed.     BP 135/86   Pulse 65   Temp 98.3 F (36.8 C) (Oral)   Ht 5\' 7"  (1.702 m)   Wt 236 lb 12.8 oz (107.4 kg)   BMI 37.09 kg/m      Assessment & Plan:  Keith Blake comes in today with chief complaint of insect bites   Diagnosis and orders addressed:  1. Insect bite of front wall of thorax, unspecified laterality, initial encounter -Do not scratch -Wear protective clothing while outside- Long sleeves and long pants -Put insect repellent on all exposed skin and along clothing -Take a shower as soon as possible after being outside -RTO if symptoms worsen or do not improve - doxycycline (VIBRA-TABS) 100 MG tablet; Take 1 tablet (100 mg total) by mouth 2 (two) times daily.  Dispense: 20 tablet; Refill: 0 - triamcinolone ointment (KENALOG) 0.5 %; Apply 1 application topically 2 (two) times daily.  Dispense: 30 g; Refill: 0  Jannifer Rodneyhristy Nyelah Emmerich, FNP

## 2018-01-17 NOTE — Patient Instructions (Signed)
Insect Bite, Adult An insect bite can make your skin red, itchy, and swollen. An insect bite is different from an insect sting, which happens when an insect injects poison (venom) into the skin. Some insects can spread disease to people through a bite. However, most insect bites do not lead to disease and are not serious. What are the causes? Insects may bite for a variety of reasons, including:  Hunger.  To defend themselves.  Insects that bite include:  Spiders.  Mosquitoes.  Ticks.  Fleas.  Ants.  Flies.  Bedbugs.  What are the signs or symptoms? Symptoms of this condition include:  Itching or pain in the bite area.  Redness and swelling in the bite area.  An open wound (skin ulcer).  In many cases, symptoms last for 2-4 days. How is this diagnosed? This condition is usually diagnosed based on symptoms and a physical exam. How is this treated? Treatment is usually not needed. Symptoms often go away on their own. When treatment is recommended, it may involve:  Applying a cream or lotion to the bitten area. This treatment helps with itching.  Taking an antibiotic medicine. This treatment is needed if the bite area gets infected.  Getting a tetanus shot.  Applying ice to the affected area.  Medicines called antihistamines. This treatment is needed if you develop an allergic reaction to the insect bite.  Follow these instructions at home: Bite area care  Do not scratch the bite area.  Keep the bite area clean and dry. Wash it every day with soap and water as told by your health care provider.  Check the bite area every day for signs of infection. Check for: ? More redness, swelling, or pain. ? Fluid or blood. ? Warmth. ? Pus. Managing pain, itching, and swelling   You may apply a baking soda paste, cortisone cream, or calamine lotion to the bite area as told by your health care provider.  If directed, applyice to the bite area. ? Put ice in a  plastic bag. ? Place a towel between your skin and the bag. ? Leave the ice on for 20 minutes, 2-3 times per day. Medicines  Apply or take over-the-counter and prescription medicines only as told by your health care provider.  If you were prescribed an antibiotic medicine, use it as told by your health care provider. Do not stop using the antibiotic even if your condition improves. General instructions  Keep all follow-up visits as told by your health care provider. This is important. How is this prevented? To help reduce your risk of insect bites:  When you are outdoors, wear clothing that covers your arms and legs.  Use insect repellent. The best insect repellents contain: ? DEET, picaridin, oil of lemon eucalyptus (OLE), or IR3535. ? Higher amounts of an active ingredient.  If your home windows do not have screens, consider installing them.  Contact a health care provider if:  You have more redness, swelling, or pain in the bite area.  You have fluid, blood, or pus coming from the bite area.  The bite area feels warm to the touch.  You have a fever. Get help right away if:  You have joint pain.  You have a rash.  You have shortness of breath.  You feel unusually tired or sleepy.  You have neck pain.  You have a headache.  You have unusual weakness.  You have chest pain.  You have nausea, vomiting, or pain in the abdomen. This   information is not intended to replace advice given to you by your health care provider. Make sure you discuss any questions you have with your health care provider. Document Released: 06/18/2004 Document Revised: 01/08/2016 Document Reviewed: 11/18/2015 [employer] Interactive Patient Education  2018 [employer]  

## 2018-03-05 ENCOUNTER — Other Ambulatory Visit: Payer: Self-pay | Admitting: Family Medicine

## 2018-04-05 ENCOUNTER — Other Ambulatory Visit: Payer: Self-pay | Admitting: Family Medicine

## 2018-04-05 DIAGNOSIS — M10021 Idiopathic gout, right elbow: Secondary | ICD-10-CM

## 2018-06-08 ENCOUNTER — Encounter: Payer: Self-pay | Admitting: Family Medicine

## 2018-06-08 ENCOUNTER — Ambulatory Visit: Payer: Self-pay | Admitting: Family Medicine

## 2018-06-08 VITALS — BP 114/66 | HR 61 | Temp 97.5°F | Ht 67.0 in | Wt 241.0 lb

## 2018-06-08 DIAGNOSIS — Z0289 Encounter for other administrative examinations: Secondary | ICD-10-CM

## 2018-06-08 DIAGNOSIS — Z024 Encounter for examination for driving license: Secondary | ICD-10-CM

## 2018-06-08 LAB — URINALYSIS
Bilirubin, UA: NEGATIVE
Glucose, UA: NEGATIVE
KETONES UA: NEGATIVE
LEUKOCYTES UA: NEGATIVE
NITRITE UA: NEGATIVE
PROTEIN UA: NEGATIVE
RBC, UA: NEGATIVE
SPEC GRAV UA: 1.02 (ref 1.005–1.030)
Urobilinogen, Ur: 0.2 mg/dL (ref 0.2–1.0)
pH, UA: 5.5 (ref 5.0–7.5)

## 2018-06-08 NOTE — Progress Notes (Signed)
Subjective:  Patient ID: Keith Blake, male    DOB: [birth year]  Age: 53 y.o. MRN: [id number]  CC: Private DOT Physical   HPI [name] presents for DOT medical clearance exam  Depression screen [facility] 2/9 01/17/2018 06/25/2017 06/08/2017  Decreased Interest 0 0 0  Down, Depressed, Hopeless 0 0 0  PHQ - 2 Score 0 0 0    History Keith Blake has a past medical history of Hypertension.   He has a past surgical history that includes Umbilical hernia repair and hemorroid banding.   His family history is not on file.He reports that he has never smoked. He has never used smokeless tobacco. He reports current alcohol use. He reports that he does not use drugs.    ROS Review of Systems  Constitutional: Negative.   HENT: Negative.   Eyes: Negative for visual disturbance.  Respiratory: Negative for cough and shortness of breath.   Cardiovascular: Negative for chest pain and leg swelling.  Gastrointestinal: Negative for abdominal pain, diarrhea, nausea and vomiting.  Genitourinary: Negative for difficulty urinating.  Musculoskeletal: Negative for arthralgias and myalgias.  Skin: Negative for rash.  Neurological: Negative for headaches.  Psychiatric/Behavioral: Negative for sleep disturbance.    Objective:  BP 114/66   Pulse 61   Temp (!) 97.5 F (36.4 C) (Oral)   Ht 5\' 7"  (1.702 m)   Wt 241 lb (109.3 kg)   BMI 37.75 kg/m   BP Readings from Last 3 Encounters:  06/08/18 114/66  01/17/18 135/86  08/03/17 111/65    Wt Readings from Last 3 Encounters:  06/08/18 241 lb (109.3 kg)  01/17/18 236 lb 12.8 oz (107.4 kg)  08/03/17 234 lb (106.1 kg)     Physical Exam Constitutional:      General: He is not in acute distress.    Appearance: He is well-developed.  HENT:     Head: Normocephalic and atraumatic.     Right Ear: External ear normal.     Left Ear: External ear normal.     Nose: Nose normal.  Eyes:     Conjunctiva/sclera: Conjunctivae normal.     Pupils: Pupils are  equal, round, and reactive to light.  Neck:     Musculoskeletal: Normal range of motion and neck supple.  Cardiovascular:     Rate and Rhythm: Normal rate and regular rhythm.     Heart sounds: Normal heart sounds. No murmur.  Pulmonary:     Effort: Pulmonary effort is normal. No respiratory distress.     Breath sounds: Normal breath sounds. No wheezing or rales.  Abdominal:     Palpations: Abdomen is soft.     Tenderness: There is no abdominal tenderness.  Musculoskeletal: Normal range of motion.  Skin:    General: Skin is warm and dry.  Neurological:     Mental Status: He is alert and oriented to person, place, and time.     Deep Tendon Reflexes: Reflexes are normal and symmetric.  Psychiatric:        Behavior: Behavior normal.        Thought Content: Thought content normal.        Judgment: Judgment normal.       Assessment & Plan:   Kashief was seen today for private dot physical.  Diagnoses and all orders for this visit:  Encounter for examination required by Department of Transportation (DOT) -     Urinalysis  Encounter for Department of Transportation (DOT) examination for driving license renewal  I have discontinued Donald PoreJorge Kley's pantoprazole, doxycycline, and triamcinolone ointment. I am also having him maintain his EPINEPHrine, bisoprolol-hydrochlorothiazide, and allopurinol.  Allergies as of 06/08/2018      Reactions   Penicillins Rash   hives      Medication List       Accurate as of June 08, 2018 11:23 AM. Always use your most recent med list.        allopurinol 100 MG tablet Commonly known as:  ZYLOPRIM TAKE 1 TABLET BY MOUTH EVERY DAY   bisoprolol-hydrochlorothiazide 5-6.25 MG tablet Commonly known as:  ZIAC TAKE 1 TABLET BY MOUTH DAILY.   EPINEPHrine 0.3 mg/0.3 mL Soaj injection Commonly known as:  EPI-PEN Inject 0.3 mLs (0.3 mg total) into the muscle once.        Follow-up: Return in about 6 months (around 12/07/2018)  for hypertension.  Mechele ClaudeWarren Corretta Munce, M.D.

## 2018-07-07 ENCOUNTER — Other Ambulatory Visit: Payer: Self-pay | Admitting: Pediatrics

## 2018-07-07 DIAGNOSIS — M10021 Idiopathic gout, right elbow: Secondary | ICD-10-CM

## 2018-07-14 ENCOUNTER — Other Ambulatory Visit: Payer: Self-pay | Admitting: Pediatrics

## 2018-07-14 DIAGNOSIS — M10021 Idiopathic gout, right elbow: Secondary | ICD-10-CM

## 2018-07-15 ENCOUNTER — Other Ambulatory Visit: Payer: Self-pay | Admitting: Family Medicine

## 2018-12-28 ENCOUNTER — Other Ambulatory Visit: Payer: Self-pay | Admitting: *Deleted

## 2018-12-28 ENCOUNTER — Encounter: Payer: Self-pay | Admitting: *Deleted

## 2018-12-28 DIAGNOSIS — M10021 Idiopathic gout, right elbow: Secondary | ICD-10-CM

## 2018-12-28 NOTE — Telephone Encounter (Signed)
Stacks. NTBS last RF 07/15/18. Last uric acid 06/25/17

## 2019-01-22 ENCOUNTER — Other Ambulatory Visit: Payer: Self-pay | Admitting: Family Medicine

## 2019-01-23 ENCOUNTER — Other Ambulatory Visit: Payer: Self-pay | Admitting: Family Medicine

## 2019-01-23 DIAGNOSIS — M10021 Idiopathic gout, right elbow: Secondary | ICD-10-CM

## 2019-02-14 ENCOUNTER — Other Ambulatory Visit: Payer: Self-pay | Admitting: Family Medicine

## 2019-03-01 ENCOUNTER — Encounter: Payer: Self-pay | Admitting: Family Medicine

## 2019-03-01 ENCOUNTER — Other Ambulatory Visit: Payer: Self-pay

## 2019-03-01 ENCOUNTER — Ambulatory Visit (INDEPENDENT_AMBULATORY_CARE_PROVIDER_SITE_OTHER): Payer: BC Managed Care – PPO | Admitting: Family Medicine

## 2019-03-01 DIAGNOSIS — I10 Essential (primary) hypertension: Secondary | ICD-10-CM | POA: Diagnosis not present

## 2019-03-01 DIAGNOSIS — M10021 Idiopathic gout, right elbow: Secondary | ICD-10-CM | POA: Diagnosis not present

## 2019-03-01 MED ORDER — ALLOPURINOL 100 MG PO TABS: 100.0000 mg | ORAL_TABLET | Freq: Every day | ORAL | 1 refills | Status: DC

## 2019-03-01 MED ORDER — BISOPROLOL-HYDROCHLOROTHIAZIDE 5-6.25 MG PO TABS: 1.0000 | ORAL_TABLET | Freq: Every day | ORAL | 1 refills | Status: DC

## 2019-03-01 NOTE — Progress Notes (Signed)
Subjective:    Patient ID: Keith Blake, male    DOB: [birth year], 53 y.o.   MRN: [id number]   HPI: Keith Blake is a 53 y.o. male presenting for  presents for  follow-up of hypertension. Patient has no history of headache chest pain or shortness of breath or recent cough. Patient also denies symptoms of TIA such as focal numbness or weakness. Patient denies side effects from medication. States taking it regularly. No recent gout attacks.    Depression screen [facility] 2/9 01/17/2018 06/25/2017 06/08/2017 01/04/2017 03/27/2016  Decreased Interest 0 0 0 0 0  Down, Depressed, Hopeless 0 0 0 0 0  PHQ - 2 Score 0 0 0 0 0     Relevant past medical, surgical, family and social history reviewed and updated as indicated.  Interim medical history since our last visit reviewed. Allergies and medications reviewed and updated.  ROS:  Review of Systems  Unremarkable except as per HPI Social History   Tobacco Use  Smoking Status Never Smoker  Smokeless Tobacco Never Used       Objective:     Wt Readings from Last 3 Encounters:  06/08/18 241 lb (109.3 kg)  01/17/18 236 lb 12.8 oz (107.4 kg)  08/03/17 234 lb (106.1 kg)     Exam deferred. Pt. Harboring due to COVID 19. Phone visit performed.   Assessment & Plan:   1. Essential hypertension   2. Acute idiopathic gout of right elbow     Meds ordered this encounter  Medications  . bisoprolol-hydrochlorothiazide (ZIAC) 5-6.25 MG tablet    Sig: Take 1 tablet by mouth daily. (Needs to be seen before next refill)    Dispense:  90 tablet    Refill:  1    RX IS OUT OF REFILLS  . allopurinol (ZYLOPRIM) 100 MG tablet    Sig: Take 1 tablet (100 mg total) by mouth daily. (Needs to be seen before next refill)    Dispense:  90 tablet    Refill:  1    RX IS OUT OF RILLS    No orders of the defined types were placed in this encounter.     Diagnoses and all orders for this visit:  Essential hypertension  Acute idiopathic gout of right  elbow -     allopurinol (ZYLOPRIM) 100 MG tablet; Take 1 tablet (100 mg total) by mouth daily. (Needs to be seen before next refill)  Other orders -     bisoprolol-hydrochlorothiazide (ZIAC) 5-6.25 MG tablet; Take 1 tablet by mouth daily. (Needs to be seen before next refill)    Virtual Visit via telephone Note  I discussed the limitations, risks, security and privacy concerns of performing an evaluation and management service by telephone and the availability of in person appointments. The patient was identified with two identifiers. Pt.expressed understanding and agreed to proceed. Pt. Is at home. Dr. Livia Snellen is in his office.  Follow Up Instructions:   I discussed the assessment and treatment plan with the patient. The patient was provided an opportunity to ask questions and all were answered. The patient agreed with the plan and demonstrated an understanding of the instructions.   The patient was advised to call back or seek an in-person evaluation if the symptoms worsen or if the condition fails to improve as anticipated.   Total minutes including chart review and phone contact time: 12   Follow up plan: Return in about 6 months (around 08/30/2019).  [name], MD [facility]

## 2019-06-13 ENCOUNTER — Other Ambulatory Visit: Payer: Self-pay

## 2019-06-13 ENCOUNTER — Ambulatory Visit (INDEPENDENT_AMBULATORY_CARE_PROVIDER_SITE_OTHER): Payer: BC Managed Care – PPO | Admitting: Family Medicine

## 2019-06-13 ENCOUNTER — Encounter: Payer: Self-pay | Admitting: Family Medicine

## 2019-06-13 VITALS — BP 121/77 | HR 67 | Temp 97.1°F | Ht 67.0 in | Wt 229.4 lb

## 2019-06-13 DIAGNOSIS — Z024 Encounter for examination for driving license: Secondary | ICD-10-CM | POA: Diagnosis not present

## 2019-06-13 LAB — URINALYSIS
Bilirubin, UA: NEGATIVE
Glucose, UA: NEGATIVE
Ketones, UA: NEGATIVE
Leukocytes,UA: NEGATIVE
Nitrite, UA: NEGATIVE
Protein,UA: NEGATIVE
RBC, UA: NEGATIVE
Specific Gravity, UA: 1.025 (ref 1.005–1.030)
Urobilinogen, Ur: 0.2 mg/dL (ref 0.2–1.0)
pH, UA: 5.5 (ref 5.0–7.5)

## 2019-06-13 NOTE — Progress Notes (Signed)
Subjective:  Patient ID: Keith Blake, male    DOB: August 23, 1965  Age: 54 y.o. MRN: [id number]  CC: Commercial Driver's License Exam   HPI [name] presents for DOT medical evaluation.   presents for  follow-up of hypertension. Patient has no history of headache chest pain or shortness of breath or recent cough. Patient also denies symptoms of TIA such as focal numbness or weakness. Patient denies side effects from medication. States taking it regularly.   Depression screen [facility] 2/9 06/13/2019 01/17/2018 06/25/2017  Decreased Interest 0 0 0  Down, Depressed, Hopeless 0 0 0  PHQ - 2 Score 0 0 0  Altered sleeping 0 - -  Tired, decreased energy 0 - -  Change in appetite 0 - -  Feeling bad or failure about yourself  0 - -  Trouble concentrating 0 - -  Moving slowly or fidgety/restless 0 - -  PHQ-9 Score 0 - -    History [name] has a past medical history of Hypertension.   He has a past surgical history that includes Umbilical hernia repair and hemorroid banding.   His family history is not on file.He reports that he has never smoked. He has never used smokeless tobacco. He reports current alcohol use. He reports that he does not use drugs.    ROS Review of Systems  Constitutional: Negative.   HENT: Negative.   Eyes: Negative for visual disturbance.  Respiratory: Negative for cough and shortness of breath.   Cardiovascular: Negative for chest pain and leg swelling.  Gastrointestinal: Negative for abdominal pain, diarrhea, nausea and vomiting.  Genitourinary: Negative for difficulty urinating.  Musculoskeletal: Negative for arthralgias and myalgias.  Skin: Negative for rash.  Neurological: Negative for headaches.  Psychiatric/Behavioral: Negative for sleep disturbance.    Objective:  BP 121/77   Pulse 67   Temp (!) 97.1 F (36.2 C) (Temporal)   Ht 5\' 7"  (1.702 m)   Wt 229 lb 6.4 oz (104.1 kg)   SpO2 97%   BMI 35.93 kg/m   BP Readings from Last 3 Encounters:    06/13/19 121/77  06/08/18 114/66  01/17/18 135/86    Wt Readings from Last 3 Encounters:  06/13/19 229 lb 6.4 oz (104.1 kg)  06/08/18 241 lb (109.3 kg)  01/17/18 236 lb 12.8 oz (107.4 kg)     Physical Exam Constitutional:      General: He is not in acute distress.    Appearance: He is well-developed.  HENT:     Head: Normocephalic and atraumatic.     Right Ear: External ear normal.     Left Ear: External ear normal.     Nose: Nose normal.  Eyes:     Conjunctiva/sclera: Conjunctivae normal.     Pupils: Pupils are equal, round, and reactive to light.  Cardiovascular:     Rate and Rhythm: Normal rate and regular rhythm.     Heart sounds: Normal heart sounds. No murmur.  Pulmonary:     Effort: Pulmonary effort is normal. No respiratory distress.     Breath sounds: Normal breath sounds. No wheezing or rales.  Abdominal:     Palpations: Abdomen is soft.     Tenderness: There is no abdominal tenderness.  Musculoskeletal:        General: Normal range of motion.     Cervical back: Normal range of motion and neck supple.  Skin:    General: Skin is warm and dry.  Neurological:     Mental Status: He is  alert and oriented to person, place, and time.     Deep Tendon Reflexes: Reflexes are normal and symmetric.  Psychiatric:        Behavior: Behavior normal.        Thought Content: Thought content normal.        Judgment: Judgment normal.       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  Allergies as of 06/13/2019      Reactions   Penicillins Rash   hives      Medication List       Accurate as of June 13, 2019  3:09 PM. If you have any questions, ask your nurse or doctor.         allopurinol 100 MG tablet Commonly known as: ZYLOPRIM Take 1 tablet (100 mg total) by mouth daily. (Needs to be seen before next refill)   bisoprolol-hydrochlorothiazide 5-6.25 MG tablet Commonly known as: ZIAC Take 1 tablet by mouth daily. (Needs to be seen before next refill)   EPINEPHrine 0.3 mg/0.3 mL Soaj injection Commonly known as: EPI-PEN Inject 0.3 mLs (0.3 mg total) into the muscle once.        Follow-up: Return in about 6 months (around 12/11/2019).  [name], M.D.

## 2019-06-13 NOTE — Patient Instructions (Addendum)
COVID-19 Vaccine Information can be found at: [email or web address] For questions related to vaccine distribution or appointments, please email [email or web address] or call [phone].    

## 2019-09-18 ENCOUNTER — Other Ambulatory Visit: Payer: Self-pay | Admitting: Family Medicine

## 2019-12-15 ENCOUNTER — Other Ambulatory Visit: Payer: Self-pay | Admitting: Family Medicine

## 2019-12-15 NOTE — Telephone Encounter (Signed)
Stacks. NTBS 30 days given 09/18/19 

## 2019-12-15 NOTE — Telephone Encounter (Signed)
Someone answered and when I asked for Emmanuelle they said he wasn't there and hung up.

## 2019-12-21 NOTE — Telephone Encounter (Signed)
Called [phone], VM not setup

## 2020-01-05 ENCOUNTER — Ambulatory Visit: Payer: BC Managed Care – PPO | Admitting: Family Medicine

## 2020-01-05 ENCOUNTER — Other Ambulatory Visit: Payer: Self-pay

## 2020-01-05 ENCOUNTER — Encounter: Payer: Self-pay | Admitting: Family Medicine

## 2020-01-05 VITALS — BP 131/84 | HR 64 | Temp 98.3°F | Ht 67.0 in | Wt 235.6 lb

## 2020-01-05 DIAGNOSIS — Z6836 Body mass index (BMI) 36.0-36.9, adult: Secondary | ICD-10-CM

## 2020-01-05 DIAGNOSIS — M10021 Idiopathic gout, right elbow: Secondary | ICD-10-CM

## 2020-01-05 DIAGNOSIS — I10 Essential (primary) hypertension: Secondary | ICD-10-CM

## 2020-01-05 DIAGNOSIS — E669 Obesity, unspecified: Secondary | ICD-10-CM | POA: Diagnosis not present

## 2020-01-05 MED ORDER — BISOPROLOL-HYDROCHLOROTHIAZIDE 5-6.25 MG PO TABS: 1.0000 | ORAL_TABLET | Freq: Every day | ORAL | 1 refills | Status: DC

## 2020-01-05 MED ORDER — ALLOPURINOL 300 MG PO TABS: 300.0000 mg | ORAL_TABLET | Freq: Every day | ORAL | 1 refills | Status: DC

## 2020-01-05 MED ORDER — COLCHICINE 0.6 MG PO TABS: ORAL_TABLET | ORAL | 2 refills | Status: DC

## 2020-01-05 NOTE — Progress Notes (Signed)
Subjective:  Patient ID: Keith Blake, male    DOB: 30-Jan-1966  Age: 54 y.o. MRN: [id number]  CC: Medication Refill   HPI Averey Trompeter presents for ran out of allopurinol a couple months ago.  It did not seem to be working however.  He has had multiple gout attacks in spite of the medicine.  He wanted to try new medicine.  However, chart review shows that his dose was fairly low.  Dose increased should be adequate.  Patient also has a current attack with 9/10 pain with erythema and swelling at the right elbow.  He has had gout attack in this location before.   Follow-up of hypertension. Patient has no history of headache chest pain or shortness of breath or recent cough. Patient also denies symptoms of TIA such as numbness weakness lateralizing. Patient checks  blood pressure at home and has not had any elevated readings recently. Patient denies side effects from his medication. States taking it regularly.   Depression screen [facility] 2/9 01/05/2020 06/13/2019 01/17/2018  Decreased Interest 0 0 0  Down, Depressed, Hopeless 0 0 0  PHQ - 2 Score 0 0 0  Altered sleeping - 0 -  Tired, decreased energy - 0 -  Change in appetite - 0 -  Feeling bad or failure about yourself  - 0 -  Trouble concentrating - 0 -  Moving slowly or fidgety/restless - 0 -  PHQ-9 Score - 0 -    History Allin has a past medical history of Hypertension.   He has a past surgical history that includes Umbilical hernia repair and hemorroid banding.   His family history is not on file.He reports that he has never smoked. He has never used smokeless tobacco. He reports current alcohol use. He reports that he does not use drugs.    ROS Review of Systems  Constitutional: Negative for fever.  Respiratory: Negative for shortness of breath.   Cardiovascular: Negative for chest pain.  Musculoskeletal: Positive for arthralgias.  Skin: Negative for rash.    Objective:  BP 131/84   Pulse 64   Temp 98.3 F (36.8 C)  (Temporal)   Ht '5\' 7"'  (1.702 m)   Wt 235 lb 9.6 oz (106.9 kg)   BMI 36.90 kg/m   BP Readings from Last 3 Encounters:  01/05/20 131/84  06/13/19 121/77  06/08/18 114/66    Wt Readings from Last 3 Encounters:  01/05/20 235 lb 9.6 oz (106.9 kg)  06/13/19 229 lb 6.4 oz (104.1 kg)  06/08/18 241 lb (109.3 kg)     Physical Exam Vitals reviewed.  Constitutional:      Appearance: He is well-developed.  HENT:     Head: Normocephalic and atraumatic.     Right Ear: External ear normal.     Left Ear: External ear normal.     Mouth/Throat:     Pharynx: No oropharyngeal exudate or posterior oropharyngeal erythema.  Eyes:     Pupils: Pupils are equal, round, and reactive to light.  Cardiovascular:     Rate and Rhythm: Normal rate and regular rhythm.     Heart sounds: No murmur heard.   Pulmonary:     Effort: No respiratory distress.     Breath sounds: Normal breath sounds.  Musculoskeletal:        General: Swelling and tenderness (with erythema at right elbow to olecranon) present.     Cervical back: Normal range of motion and neck supple.  Neurological:     Mental Status:  He is alert and oriented to person, place, and time.       Assessment & Plan:   Stevan was seen today for medication refill.  Diagnoses and all orders for this visit:  Essential hypertension -     bisoprolol-hydrochlorothiazide (ZIAC) 5-6.25 MG tablet; Take 1 tablet by mouth daily. -     CBC with Differential/Platelet -     CMP14+EGFR  Class 2 obesity without serious comorbidity with body mass index (BMI) of 36.0 to 36.9 in adult, unspecified obesity type -     CBC with Differential/Platelet -     CMP14+EGFR  Acute idiopathic gout of right elbow -     allopurinol (ZYLOPRIM) 300 MG tablet; Take 1 tablet (300 mg total) by mouth daily. Start after current gout attack goes away. -     CBC with Differential/Platelet -     CMP14+EGFR -     Uric acid -     colchicine 0.6 MG tablet; Take twice daily for  gout attack. (may take every two hours up to 6 doses at acute onset)       I have changed Cory Roughen Trapani's allopurinol and bisoprolol-hydrochlorothiazide. I am also having him start on colchicine. Additionally, I am having him maintain his EPINEPHrine.  Allergies as of 01/05/2020      Reactions   Penicillins Rash   hives      Medication List       Accurate as of January 05, 2020  4:53 PM. If you have any questions, ask your nurse or doctor.        allopurinol 300 MG tablet Commonly known as: ZYLOPRIM Take 1 tablet (300 mg total) by mouth daily. Start after current gout attack goes away. What changed:   medication strength  how much to take  additional instructions Changed by: [name], MD   bisoprolol-hydrochlorothiazide 5-6.25 MG tablet Commonly known as: ZIAC Take 1 tablet by mouth daily. What changed: additional instructions Changed by: [name], MD   colchicine 0.6 MG tablet Take twice daily for gout attack. (may take every two hours up to 6 doses at acute onset) Started by: [name], MD   EPINEPHrine 0.3 mg/0.3 mL Soaj injection Commonly known as: EPI-PEN Inject 0.3 mLs (0.3 mg total) into the muscle once.        Follow-up: Return in about 6 months (around 07/07/2020).  [name], M.D.

## 2020-01-06 LAB — CBC WITH DIFFERENTIAL/PLATELET
Basophils Absolute: 0.1 10*3/uL (ref 0.0–0.2)
Basos: 1 %
EOS (ABSOLUTE): 0.2 10*3/uL (ref 0.0–0.4)
Eos: 2 %
Hematocrit: 42.1 % (ref 37.5–51.0)
Hemoglobin: 14.1 g/dL (ref 13.0–17.7)
Immature Grans (Abs): 0 10*3/uL (ref 0.0–0.1)
Immature Granulocytes: 0 %
Lymphocytes Absolute: 2.9 10*3/uL (ref 0.7–3.1)
Lymphs: 31 %
MCH: 30.9 pg (ref 26.6–33.0)
MCHC: 33.5 g/dL (ref 31.5–35.7)
MCV: 92 fL (ref 79–97)
Monocytes Absolute: 0.7 10*3/uL (ref 0.1–0.9)
Monocytes: 7 %
Neutrophils Absolute: 5.6 10*3/uL (ref 1.4–7.0)
Neutrophils: 59 %
Platelets: 292 10*3/uL (ref 150–450)
RBC: 4.56 x10E6/uL (ref 4.14–5.80)
RDW: 12.8 % (ref 11.6–15.4)
WBC: 9.5 10*3/uL (ref 3.4–10.8)

## 2020-01-06 LAB — CMP14+EGFR
ALT: 25 IU/L (ref 0–44)
AST: 22 IU/L (ref 0–40)
Albumin/Globulin Ratio: 1.9 (ref 1.2–2.2)
Albumin: 4.3 g/dL (ref 3.8–4.9)
Alkaline Phosphatase: 67 IU/L (ref 48–121)
BUN/Creatinine Ratio: 15 (ref 9–20)
BUN: 16 mg/dL (ref 6–24)
Bilirubin Total: 0.3 mg/dL (ref 0.0–1.2)
CO2: 22 mmol/L (ref 20–29)
Calcium: 9.4 mg/dL (ref 8.7–10.2)
Chloride: 103 mmol/L (ref 96–106)
Creatinine, Ser: 1.06 mg/dL (ref 0.76–1.27)
GFR calc Af Amer: 92 mL/min/{1.73_m2} (ref >59)
GFR calc non Af Amer: 79 mL/min/{1.73_m2} (ref >59)
Globulin, Total: 2.3 g/dL (ref 1.5–4.5)
Glucose: 94 mg/dL (ref 65–99)
Potassium: 4.1 mmol/L (ref 3.5–5.2)
Sodium: 143 mmol/L (ref 134–144)
Total Protein: 6.6 g/dL (ref 6.0–8.5)

## 2020-01-06 LAB — URIC ACID: Uric Acid: 7.2 mg/dL (ref 3.8–8.4)

## 2020-01-10 NOTE — Progress Notes (Signed)
Hello Wilma,  Your lab result is normal and/or stable.Some minor variations that are not significant are commonly marked abnormal, but do not represent any medical problem for you.  Best regards, Mechele Claude, M.D.

## 2020-02-02 ENCOUNTER — Telehealth (INDEPENDENT_AMBULATORY_CARE_PROVIDER_SITE_OTHER): Payer: BC Managed Care – PPO | Admitting: Nurse Practitioner

## 2020-02-02 DIAGNOSIS — R0981 Nasal congestion: Secondary | ICD-10-CM

## 2020-02-02 MED ORDER — FLUTICASONE PROPIONATE 50 MCG/ACT NA SUSP: 2.0000 | Freq: Every day | NASAL | 6 refills | Status: DC

## 2020-02-02 MED ORDER — CHLORPHEN-PE-ACETAMINOPHEN 4-10-325 MG PO TABS: 1.0000 | ORAL_TABLET | Freq: Four times a day (QID) | ORAL | 0 refills | Status: DC | PRN

## 2020-02-02 NOTE — Progress Notes (Signed)
° °  Virtual Visit via telephone Note Due to COVID-19 pandemic this visit was conducted virtually. This visit type was conducted due to national recommendations for restrictions regarding the COVID-19 Pandemic (e.g. social distancing, sheltering in place) in an effort to limit this patient's exposure and mitigate transmission in our community. All issues noted in this document were discussed and addressed.  A physical exam was not performed with this format.  I connected with Keith Blake on 02/02/20 at 4:10 by telephone and verified that I am speaking with the correct person using two identifiers. Keith Blake is currently located at home and no one is currently with him during visit. The provider, Mary-Margaret Daphine Deutscher, FNP is located in their office at time of visit.  I discussed the limitations, risks, security and privacy concerns of performing an evaluation and management service by telephone and the availability of in person appointments. I also discussed with the patient that there may be a patient responsible charge related to this service. The patient expressed understanding and agreed to proceed.   History and Present Illness:    Chief Complaint: Sinusitis   HPI Patient has been sick for 2 weeks. Has been using afrin in his nose for 2 weeks. He is very congestion.   Review of Systems  Constitutional: Negative for chills and fever.  HENT: Positive for congestion. Negative for sinus pain.   Respiratory: Negative for cough.   Neurological: Positive for headaches.  All other systems reviewed and are negative.    Observations/Objective: Alert and oriented- answers all questions appropriately No distress Voice hoarse    Assessment and Plan: Keith Blake in today with chief complaint of Sinusitis   1. Congestion of nasal sinus Probably due to afrin nasal spray STOP AFRIN now. - fluticasone (FLONASE) 50 MCG/ACT nasal spray; Place 2 sprays into both nostrils daily.   Dispense: 16 g; Refill: 6 - Chlorphen-PE-Acetaminophen 4-10-325 MG TABS; Take 1 tablet by mouth every 6 (six) hours as needed.  Dispense: 20 tablet; Refill: 0    Follow Up Instructions: prn    I discussed the assessment and treatment plan with the patient. The patient was provided an opportunity to ask questions and all were answered. The patient agreed with the plan and demonstrated an understanding of the instructions.   The patient was advised to call back or seek an in-person evaluation if the symptoms worsen or if the condition fails to improve as anticipated.  The above assessment and management plan was discussed with the patient. The patient verbalized understanding of and has agreed to the management plan. Patient is aware to call the clinic if symptoms persist or worsen. Patient is aware when to return to the clinic for a follow-up visit. Patient educated on when it is appropriate to go to the emergency department.   Time call ended:  4:20  I provided 15 minutes of non-face-to-face time during this encounter.    Mary-Margaret Daphine Deutscher, FNP

## 2020-06-12 ENCOUNTER — Ambulatory Visit (INDEPENDENT_AMBULATORY_CARE_PROVIDER_SITE_OTHER): Payer: BC Managed Care – PPO | Admitting: Family Medicine

## 2020-06-12 ENCOUNTER — Encounter: Payer: Self-pay | Admitting: Family Medicine

## 2020-06-12 ENCOUNTER — Other Ambulatory Visit: Payer: Self-pay

## 2020-06-12 VITALS — BP 123/84 | HR 64 | Temp 97.0°F | Resp 20 | Ht 67.0 in | Wt 247.4 lb

## 2020-06-12 DIAGNOSIS — Z024 Encounter for examination for driving license: Secondary | ICD-10-CM

## 2020-06-12 LAB — URINALYSIS
Bilirubin, UA: NEGATIVE
Glucose, UA: NEGATIVE
Ketones, UA: NEGATIVE
Leukocytes,UA: NEGATIVE
Nitrite, UA: NEGATIVE
Protein,UA: NEGATIVE
RBC, UA: NEGATIVE
Specific Gravity, UA: 1.025 (ref 1.005–1.030)
Urobilinogen, Ur: 0.2 mg/dL (ref 0.2–1.0)
pH, UA: 6 (ref 5.0–7.5)

## 2020-06-12 NOTE — Progress Notes (Signed)
DOT exam performed. Pt has high blood pressure, controlled. Qualifies for 1 yr. See attached ME form

## 2020-07-11 ENCOUNTER — Other Ambulatory Visit: Payer: Self-pay | Admitting: Family Medicine

## 2020-07-11 DIAGNOSIS — I10 Essential (primary) hypertension: Secondary | ICD-10-CM

## 2020-07-27 ENCOUNTER — Other Ambulatory Visit: Payer: Self-pay | Admitting: Family Medicine

## 2020-07-27 DIAGNOSIS — M10021 Idiopathic gout, right elbow: Secondary | ICD-10-CM

## 2020-08-29 ENCOUNTER — Other Ambulatory Visit: Payer: Self-pay | Admitting: Family Medicine

## 2020-08-29 DIAGNOSIS — M10021 Idiopathic gout, right elbow: Secondary | ICD-10-CM

## 2020-08-29 NOTE — Telephone Encounter (Signed)
Stacks. NTBS 30 days given 07/29/20

## 2020-10-02 ENCOUNTER — Other Ambulatory Visit: Payer: Self-pay | Admitting: Family Medicine

## 2020-10-02 DIAGNOSIS — M10021 Idiopathic gout, right elbow: Secondary | ICD-10-CM

## 2021-01-16 ENCOUNTER — Other Ambulatory Visit: Payer: Self-pay | Admitting: Family Medicine

## 2021-01-16 DIAGNOSIS — I10 Essential (primary) hypertension: Secondary | ICD-10-CM

## 2021-01-30 DIAGNOSIS — M79671 Pain in right foot: Secondary | ICD-10-CM | POA: Diagnosis not present

## 2021-01-30 DIAGNOSIS — L600 Ingrowing nail: Secondary | ICD-10-CM | POA: Diagnosis not present

## 2021-01-30 DIAGNOSIS — M79674 Pain in right toe(s): Secondary | ICD-10-CM | POA: Diagnosis not present

## 2021-02-13 DIAGNOSIS — M79674 Pain in right toe(s): Secondary | ICD-10-CM | POA: Diagnosis not present

## 2021-02-13 DIAGNOSIS — M79671 Pain in right foot: Secondary | ICD-10-CM | POA: Diagnosis not present

## 2021-02-13 DIAGNOSIS — L03031 Cellulitis of right toe: Secondary | ICD-10-CM | POA: Diagnosis not present

## 2021-02-17 ENCOUNTER — Other Ambulatory Visit: Payer: Self-pay | Admitting: Family Medicine

## 2021-02-17 DIAGNOSIS — I10 Essential (primary) hypertension: Secondary | ICD-10-CM

## 2021-02-17 NOTE — Telephone Encounter (Signed)
Stacks NTBS 30 days given 01/16/21 

## 2021-02-27 ENCOUNTER — Other Ambulatory Visit: Payer: Self-pay | Admitting: Family Medicine

## 2021-02-27 DIAGNOSIS — I10 Essential (primary) hypertension: Secondary | ICD-10-CM

## 2021-02-27 DIAGNOSIS — M79672 Pain in left foot: Secondary | ICD-10-CM | POA: Diagnosis not present

## 2021-02-27 DIAGNOSIS — L03031 Cellulitis of right toe: Secondary | ICD-10-CM | POA: Diagnosis not present

## 2021-02-27 DIAGNOSIS — M79675 Pain in left toe(s): Secondary | ICD-10-CM | POA: Diagnosis not present

## 2021-03-03 ENCOUNTER — Telehealth: Payer: Self-pay | Admitting: Family Medicine

## 2021-03-03 DIAGNOSIS — I10 Essential (primary) hypertension: Secondary | ICD-10-CM

## 2021-03-03 MED ORDER — BISOPROLOL-HYDROCHLOROTHIAZIDE 5-6.25 MG PO TABS: 1.0000 | ORAL_TABLET | Freq: Every day | ORAL | 0 refills | Status: DC

## 2021-03-03 NOTE — Telephone Encounter (Signed)
  Prescription Request  03/03/2021  What is the name of the medication or equipment? BP Meds  Have you contacted your pharmacy to request a refill? Yes  Which pharmacy would you like this sent to? [facility]  Pt was told that he was overdue for an appt which is why his refill request was denied. Pt scheduled an appt to see Dr Darlyn Read on 11/14 (first available appt slot) and needs refill called in to last him until his appt.

## 2021-03-04 NOTE — Telephone Encounter (Signed)
NA/NVM on 2 ph#s, refill was sent to pharmacy yesterday

## 2021-03-17 DIAGNOSIS — M79671 Pain in right foot: Secondary | ICD-10-CM | POA: Diagnosis not present

## 2021-03-17 DIAGNOSIS — M79674 Pain in right toe(s): Secondary | ICD-10-CM | POA: Diagnosis not present

## 2021-03-17 DIAGNOSIS — L03031 Cellulitis of right toe: Secondary | ICD-10-CM | POA: Diagnosis not present

## 2021-03-17 DIAGNOSIS — L600 Ingrowing nail: Secondary | ICD-10-CM | POA: Diagnosis not present

## 2021-03-25 ENCOUNTER — Other Ambulatory Visit: Payer: Self-pay | Admitting: Family Medicine

## 2021-03-25 DIAGNOSIS — I10 Essential (primary) hypertension: Secondary | ICD-10-CM

## 2021-04-07 ENCOUNTER — Other Ambulatory Visit: Payer: Self-pay

## 2021-04-07 ENCOUNTER — Encounter: Payer: Self-pay | Admitting: Family Medicine

## 2021-04-07 ENCOUNTER — Ambulatory Visit: Payer: BC Managed Care – PPO | Admitting: Family Medicine

## 2021-04-07 VITALS — BP 134/72 | HR 71 | Temp 97.5°F | Ht 67.0 in | Wt 244.6 lb

## 2021-04-07 DIAGNOSIS — M1A00X0 Idiopathic chronic gout, unspecified site, without tophus (tophi): Secondary | ICD-10-CM | POA: Diagnosis not present

## 2021-04-07 DIAGNOSIS — I10 Essential (primary) hypertension: Secondary | ICD-10-CM

## 2021-04-07 MED ORDER — BISOPROLOL-HYDROCHLOROTHIAZIDE 5-6.25 MG PO TABS: 1.0000 | ORAL_TABLET | Freq: Every day | ORAL | 3 refills | Status: DC

## 2021-04-07 NOTE — Progress Notes (Signed)
Subjective:  Patient ID: Keith Blake, male    DOB: [birth year]  Age: 55 y.o. MRN: [id number]  CC: Medical Management of Chronic Issues   HPI Keith Blake presents for  follow-up of hypertension. Patient has no history of headache chest pain or shortness of breath or recent cough. Patient also denies symptoms of TIA such as focal numbness or weakness. Patient denies side effects from medication. States taking it regularly.  Also DCed gout meds. Hasn't flared in over a year. Off meds for about a year.   History Keith Blake has a past medical history of Hypertension.   He has a past surgical history that includes Umbilical hernia repair and hemorroid banding.   His family history is not on file.He reports that he has never smoked. He has never used smokeless tobacco. He reports current alcohol use. He reports that he does not use drugs.  Current Outpatient Medications on File Prior to Visit  Medication Sig Dispense Refill   colchicine 0.6 MG tablet Take twice daily for gout attack. (may take every two hours up to 6 doses at acute onset) 60 tablet 2   EPINEPHrine 0.3 mg/0.3 mL IJ SOAJ injection Inject 0.3 mLs (0.3 mg total) into the muscle once. 1 Device 2   allopurinol (ZYLOPRIM) 300 MG tablet Take 1 tablet (300 mg total) by mouth daily. Start after current gout attack goes away. (NEEDS TO BE SEEN BEFORE NEXT REFILL) 30 tablet 0   fluticasone (FLONASE) 50 MCG/ACT nasal spray Place 2 sprays into both nostrils daily. (Patient not taking: Reported on 04/07/2021) 16 g 6   No current facility-administered medications on file prior to visit.    ROS Review of Systems  Constitutional:  Negative for fever.  Respiratory:  Negative for shortness of breath.   Cardiovascular:  Negative for chest pain.  Musculoskeletal:  Negative for arthralgias.  Skin:  Negative for rash.   Objective:  BP 134/72   Pulse 71   Temp (!) 97.5 F (36.4 C)   Ht _0  (1.702 m)   Wt 244 lb 9.6 oz (110.9 kg)    SpO2 98%   BMI 38.31 kg/m   BP Readings from Last 3 Encounters:  04/07/21 134/72  06/12/20 123/84  01/05/20 131/84    Wt Readings from Last 3 Encounters:  04/07/21 244 lb 9.6 oz (110.9 kg)  06/12/20 247 lb 6 oz (112.2 kg)  01/05/20 235 lb 9.6 oz (106.9 kg)     Physical Exam Vitals reviewed.  Constitutional:      Appearance: He is well-developed.  HENT:     Head: Normocephalic and atraumatic.     Right Ear: External ear normal.     Left Ear: External ear normal.     Mouth/Throat:     Pharynx: No oropharyngeal exudate or posterior oropharyngeal erythema.  Eyes:     Pupils: Pupils are equal, round, and reactive to light.  Cardiovascular:     Rate and Rhythm: Normal rate and regular rhythm.     Heart sounds: No murmur heard. Pulmonary:     Effort: No respiratory distress.     Breath sounds: Normal breath sounds.  Musculoskeletal:     Cervical back: Normal range of motion and neck supple.  Neurological:     Mental Status: He is alert and oriented to person, place, and time.      Assessment & Plan:   Keith Blake was seen today for medical management of chronic issues.  Diagnoses and all orders for this visit:  Essential hypertension -     CBC with Differential/Platelet -     CMP14+EGFR -     bisoprolol-hydrochlorothiazide (ZIAC) 5-6.25 MG tablet; Take 1 tablet by mouth daily. -     Uric acid  Idiopathic chronic gout without tophus, unspecified site -     CBC with Differential/Platelet -     CMP14+EGFR -     Uric acid  Allergies as of 04/07/2021       Reactions   Penicillins Rash   hives        Medication List        Accurate as of April 07, 2021  7:40 PM. If you have any questions, ask your nurse or doctor.          STOP taking these medications    Chlorphen-PE-Acetaminophen 4-10-325 MG Tabs Stopped by: [name], MD       TAKE these medications    allopurinol 300 MG tablet Commonly known as: ZYLOPRIM Take 1 tablet (300 mg total)  by mouth daily. Start after current gout attack goes away. (NEEDS TO BE SEEN BEFORE NEXT REFILL)   bisoprolol-hydrochlorothiazide 5-6.25 MG tablet Commonly known as: ZIAC Take 1 tablet by mouth daily.   colchicine 0.6 MG tablet Take twice daily for gout attack. (may take every two hours up to 6 doses at acute onset)   EPINEPHrine 0.3 mg/0.3 mL Soaj injection Commonly known as: EPI-PEN Inject 0.3 mLs (0.3 mg total) into the muscle once.   fluticasone 50 MCG/ACT nasal spray Commonly known as: FLONASE Place 2 sprays into both nostrils daily.        Meds ordered this encounter  Medications   bisoprolol-hydrochlorothiazide (ZIAC) 5-6.25 MG tablet    Sig: Take 1 tablet by mouth daily.    Dispense:  90 tablet    Refill:  3      Follow-up: Return in about 6 months (around 10/05/2021).  [name], M.D.

## 2021-04-08 LAB — CBC WITH DIFFERENTIAL/PLATELET
Basophils Absolute: 0 10*3/uL (ref 0.0–0.2)
Basos: 0 %
EOS (ABSOLUTE): 0.1 10*3/uL (ref 0.0–0.4)
Eos: 1 %
Hematocrit: 43.9 % (ref 37.5–51.0)
Hemoglobin: 14.5 g/dL (ref 13.0–17.7)
Immature Grans (Abs): 0.1 10*3/uL (ref 0.0–0.1)
Immature Granulocytes: 1 %
Lymphocytes Absolute: 2.8 10*3/uL (ref 0.7–3.1)
Lymphs: 34 %
MCH: 30.5 pg (ref 26.6–33.0)
MCHC: 33 g/dL (ref 31.5–35.7)
MCV: 92 fL (ref 79–97)
Monocytes Absolute: 0.6 10*3/uL (ref 0.1–0.9)
Monocytes: 8 %
Neutrophils Absolute: 4.7 10*3/uL (ref 1.4–7.0)
Neutrophils: 56 %
Platelets: 254 10*3/uL (ref 150–450)
RBC: 4.75 x10E6/uL (ref 4.14–5.80)
RDW: 12.7 % (ref 11.6–15.4)
WBC: 8.3 10*3/uL (ref 3.4–10.8)

## 2021-04-08 LAB — CMP14+EGFR
ALT: 21 IU/L (ref 0–44)
AST: 20 IU/L (ref 0–40)
Albumin/Globulin Ratio: 2.1 (ref 1.2–2.2)
Albumin: 4.6 g/dL (ref 3.8–4.9)
Alkaline Phosphatase: 63 IU/L (ref 44–121)
BUN/Creatinine Ratio: 11 (ref 9–20)
BUN: 10 mg/dL (ref 6–24)
Bilirubin Total: 0.5 mg/dL (ref 0.0–1.2)
CO2: 22 mmol/L (ref 20–29)
Calcium: 10 mg/dL (ref 8.7–10.2)
Chloride: 101 mmol/L (ref 96–106)
Creatinine, Ser: 0.89 mg/dL (ref 0.76–1.27)
Globulin, Total: 2.2 g/dL (ref 1.5–4.5)
Glucose: 105 mg/dL — ABNORMAL HIGH (ref 70–99)
Potassium: 3.9 mmol/L (ref 3.5–5.2)
Sodium: 140 mmol/L (ref 134–144)
Total Protein: 6.8 g/dL (ref 6.0–8.5)
eGFR: 101 mL/min/{1.73_m2} (ref >59)

## 2021-04-08 LAB — URIC ACID: Uric Acid: 7.5 mg/dL (ref 3.8–8.4)

## 2021-04-08 NOTE — Progress Notes (Signed)
Hello Wilma,  Your lab result is normal and/or stable.Some minor variations that are not significant are commonly marked abnormal, but do not represent any medical problem for you.  Best regards, Mechele Claude, M.D.

## 2021-06-12 ENCOUNTER — Ambulatory Visit: Payer: Self-pay | Admitting: Family Medicine

## 2021-06-12 ENCOUNTER — Encounter: Payer: Self-pay | Admitting: Family Medicine

## 2021-06-12 VITALS — Ht 67.0 in

## 2021-06-12 DIAGNOSIS — G8929 Other chronic pain: Secondary | ICD-10-CM

## 2021-06-12 DIAGNOSIS — Z024 Encounter for examination for driving license: Secondary | ICD-10-CM

## 2021-06-12 DIAGNOSIS — M25512 Pain in left shoulder: Secondary | ICD-10-CM

## 2021-06-12 LAB — URINALYSIS
Bilirubin, UA: NEGATIVE
Glucose, UA: NEGATIVE
Ketones, UA: NEGATIVE
Leukocytes,UA: NEGATIVE
Nitrite, UA: NEGATIVE
Protein,UA: NEGATIVE
RBC, UA: NEGATIVE
Specific Gravity, UA: 1.015 (ref 1.005–1.030)
Urobilinogen, Ur: 0.2 mg/dL (ref 0.2–1.0)
pH, UA: 6 (ref 5.0–7.5)

## 2021-06-12 NOTE — Progress Notes (Signed)
Subjective:  Patient ID: Keith Blake, male    DOB: 11-05-1965  Age: 56 y.o. MRN: [id number]  CC: DOT PHYSICAL   HPI Keith Blake presents for DOT Exam  Depression screen [facility] 2/9 06/12/2021 04/07/2021 06/12/2020  Decreased Interest 0 0 0  Down, Depressed, Hopeless 0 0 0  PHQ - 2 Score 0 0 0  Altered sleeping - - -  Tired, decreased energy - - -  Change in appetite - - -  Feeling bad or failure about yourself  - - -  Trouble concentrating - - -  Moving slowly or fidgety/restless - - -  PHQ-9 Score - - -    History Keith Blake has a past medical history of Hypertension.   He has a past surgical history that includes Umbilical hernia repair and hemorroid banding.   His family history is not on file.He reports that he has never smoked. He has never used smokeless tobacco. He reports current alcohol use. He reports that he does not use drugs.    ROS Review of Systems  Constitutional: Negative.   HENT: Negative.    Eyes:  Negative for visual disturbance.  Respiratory:  Negative for cough and shortness of breath.   Cardiovascular:  Negative for chest pain and leg swelling.  Gastrointestinal:  Negative for abdominal pain, diarrhea, nausea and vomiting.  Genitourinary:  Negative for difficulty urinating.  Musculoskeletal:  Positive for arthralgias (left shoulder, chronic - six months or more). Negative for myalgias.  Skin:  Negative for rash.  Neurological:  Negative for headaches.  Psychiatric/Behavioral:  Negative for sleep disturbance.    Objective:  Ht 5\' 7"  (1.702 m)    BMI 38.31 kg/m   BP Readings from Last 3 Encounters:  04/07/21 134/72  06/12/20 123/84  01/05/20 131/84    Wt Readings from Last 3 Encounters:  04/07/21 244 lb 9.6 oz (110.9 kg)  06/12/20 247 lb 6 oz (112.2 kg)  01/05/20 235 lb 9.6 oz (106.9 kg)     Physical Exam Constitutional:      General: He is not in acute distress.    Appearance: He is well-developed.  HENT:     Head: Normocephalic  and atraumatic.     Right Ear: External ear normal.     Left Ear: External ear normal.     Nose: Nose normal.  Eyes:     Conjunctiva/sclera: Conjunctivae normal.     Pupils: Pupils are equal, round, and reactive to light.  Cardiovascular:     Rate and Rhythm: Normal rate and regular rhythm.     Heart sounds: Normal heart sounds. No murmur heard. Pulmonary:     Effort: Pulmonary effort is normal. No respiratory distress.     Breath sounds: Normal breath sounds. No wheezing or rales.  Abdominal:     Palpations: Abdomen is soft.     Tenderness: There is no abdominal tenderness.  Musculoskeletal:        General: Normal range of motion.     Cervical back: Normal range of motion and neck supple.  Skin:    General: Skin is warm and dry.  Neurological:     Mental Status: He is alert and oriented to person, place, and time.     Deep Tendon Reflexes: Reflexes are normal and symmetric.  Psychiatric:        Behavior: Behavior normal.        Thought Content: Thought content normal.        Judgment: Judgment normal.  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  Diagnoses and all orders for this visit:  Encounter for Department of Transportation (DOT) examination for trucking license  Driver's permit physical examination -     Urinalysis  Chronic left shoulder pain -     Ambulatory referral to Orthopedics       I am having Phillip Toops maintain his EPINEPHrine, colchicine, fluticasone, allopurinol, and bisoprolol-hydrochlorothiazide.  Allergies as of 06/12/2021       Reactions   Penicillins Rash   hives        Medication List        Accurate as of June 12, 2021  1:50 PM. If you have any questions, ask your nurse or doctor.          allopurinol 300 MG tablet Commonly known as: ZYLOPRIM Take 1 tablet (300 mg total) by mouth daily. Start after current gout attack goes away. (NEEDS TO BE SEEN BEFORE NEXT REFILL)    bisoprolol-hydrochlorothiazide 5-6.25 MG tablet Commonly known as: ZIAC Take 1 tablet by mouth daily.   colchicine 0.6 MG tablet Take twice daily for gout attack. (may take every two hours up to 6 doses at acute onset)   EPINEPHrine 0.3 mg/0.3 mL Soaj injection Commonly known as: EPI-PEN Inject 0.3 mLs (0.3 mg total) into the muscle once.   fluticasone 50 MCG/ACT nasal spray Commonly known as: FLONASE Place 2 sprays into both nostrils daily.         Follow-up: Return in about 1 year (around 06/12/2022), or if symptoms worsen or fail to improve.  [name], M.D.

## 2021-08-13 ENCOUNTER — Telehealth: Payer: Self-pay | Admitting: Family Medicine

## 2021-08-13 NOTE — Telephone Encounter (Signed)
Pt called to check status of his referral. Says its been a while and he has not received any calls from anyone. ? ?Please call patient on his cell phone regarding referral. [phone] ?

## 2021-08-14 NOTE — Telephone Encounter (Signed)
Contact information given to patient.

## 2021-08-14 NOTE — Telephone Encounter (Signed)
Please notify pt. That all he has to do is call ortho [place] ?

## 2021-08-19 DIAGNOSIS — M542 Cervicalgia: Secondary | ICD-10-CM | POA: Diagnosis not present

## 2021-08-19 DIAGNOSIS — M25512 Pain in left shoulder: Secondary | ICD-10-CM | POA: Diagnosis not present

## 2021-12-08 DIAGNOSIS — M25512 Pain in left shoulder: Secondary | ICD-10-CM | POA: Diagnosis not present

## 2021-12-08 DIAGNOSIS — M546 Pain in thoracic spine: Secondary | ICD-10-CM | POA: Diagnosis not present

## 2021-12-08 DIAGNOSIS — G8929 Other chronic pain: Secondary | ICD-10-CM | POA: Diagnosis not present

## 2021-12-19 DIAGNOSIS — M25512 Pain in left shoulder: Secondary | ICD-10-CM | POA: Diagnosis not present

## 2022-05-21 ENCOUNTER — Other Ambulatory Visit: Payer: Self-pay | Admitting: Family Medicine

## 2022-05-21 DIAGNOSIS — I10 Essential (primary) hypertension: Secondary | ICD-10-CM

## 2022-06-15 ENCOUNTER — Ambulatory Visit (INDEPENDENT_AMBULATORY_CARE_PROVIDER_SITE_OTHER): Payer: BC Managed Care – PPO | Admitting: Nurse Practitioner

## 2022-06-15 ENCOUNTER — Encounter: Payer: Self-pay | Admitting: Nurse Practitioner

## 2022-06-15 DIAGNOSIS — Z024 Encounter for examination for driving license: Secondary | ICD-10-CM

## 2022-06-15 LAB — URINALYSIS
Bilirubin, UA: NEGATIVE
Glucose, UA: NEGATIVE
Ketones, UA: NEGATIVE
Leukocytes,UA: NEGATIVE
Nitrite, UA: NEGATIVE
Protein,UA: NEGATIVE
RBC, UA: NEGATIVE
Specific Gravity, UA: 1.025 (ref 1.005–1.030)
Urobilinogen, Ur: 0.2 mg/dL (ref 0.2–1.0)
pH, UA: 7 (ref 5.0–7.5)

## 2022-06-15 NOTE — Progress Notes (Signed)
Private DOt- see scanned n document

## 2022-08-31 ENCOUNTER — Other Ambulatory Visit: Payer: Self-pay | Admitting: Family Medicine

## 2022-08-31 DIAGNOSIS — I10 Essential (primary) hypertension: Secondary | ICD-10-CM

## 2022-12-06 ENCOUNTER — Other Ambulatory Visit: Payer: Self-pay | Admitting: Family Medicine

## 2022-12-06 DIAGNOSIS — I10 Essential (primary) hypertension: Secondary | ICD-10-CM

## 2022-12-31 ENCOUNTER — Other Ambulatory Visit: Payer: Self-pay | Admitting: Family Medicine

## 2022-12-31 DIAGNOSIS — I10 Essential (primary) hypertension: Secondary | ICD-10-CM

## 2023-01-14 ENCOUNTER — Ambulatory Visit: Payer: BC Managed Care – PPO | Admitting: Family Medicine

## 2023-01-14 ENCOUNTER — Encounter: Payer: Self-pay | Admitting: Family Medicine

## 2023-01-14 DIAGNOSIS — I10 Essential (primary) hypertension: Secondary | ICD-10-CM

## 2023-01-14 MED ORDER — BISOPROLOL-HYDROCHLOROTHIAZIDE 5-6.25 MG PO TABS: 1.0000 | ORAL_TABLET | Freq: Every day | ORAL | 1 refills | Status: DC

## 2023-01-14 NOTE — Progress Notes (Signed)
Subjective:  Patient ID: Keith Blake, male    DOB: 07-06-1965  Age: 57 y.o. MRN: [id number]  CC: No chief complaint on file.   HPI Keith Blake presents for  presents for  follow-up of hypertension. Patient has no history of headache chest pain or shortness of breath or recent cough. Patient also denies symptoms of TIA such as focal numbness or weakness. Patient denies side effects from medication. States taking it regularly.      01/14/2023    3:40 PM 06/12/2021    1:16 PM 04/07/2021    4:14 PM  Depression screen PHQ 2/9  Decreased Interest 0 0 0  Down, Depressed, Hopeless 0 0 0  PHQ - 2 Score 0 0 0  Altered sleeping 0    Tired, decreased energy 0    Change in appetite 0    Feeling bad or failure about yourself  0    Trouble concentrating 0    Moving slowly or fidgety/restless 0    Suicidal thoughts 0    PHQ-9 Score 0      History Keith Blake has a past medical history of Hypertension.   Keith Blake has a past surgical history that includes Umbilical hernia repair and hemorroid banding.   His family history is not on file.Keith Blake reports that Keith Blake has never smoked. Keith Blake has never used smokeless tobacco. Keith Blake reports current alcohol use. Keith Blake reports that Keith Blake does not use drugs.    ROS Review of Systems  Constitutional:  Negative for fever.  Respiratory:  Negative for shortness of breath.   Cardiovascular:  Negative for chest pain.  Musculoskeletal:  Negative for arthralgias.  Skin:  Negative for rash.    Objective:  BP 133/88   Pulse (!) 59   Temp (!) 97.3 F (36.3 C) (Oral)   Resp 20   Ht 5\' 7"  (1.702 m)   Wt 255 lb (115.7 kg)   SpO2 97%   BMI 39.94 kg/m   BP Readings from Last 3 Encounters:  01/14/23 133/88  04/07/21 134/72  06/12/20 123/84    Wt Readings from Last 3 Encounters:  01/14/23 255 lb (115.7 kg)  04/07/21 244 lb 9.6 oz (110.9 kg)  06/12/20 247 lb 6 oz (112.2 kg)     Physical Exam Vitals reviewed.  Constitutional:      Appearance: Keith Blake is well-developed.   HENT:     Head: Normocephalic and atraumatic.     Right Ear: External ear normal.     Left Ear: External ear normal.     Mouth/Throat:     Pharynx: No oropharyngeal exudate or posterior oropharyngeal erythema.  Eyes:     Pupils: Pupils are equal, round, and reactive to light.  Cardiovascular:     Rate and Rhythm: Normal rate and regular rhythm.     Heart sounds: No murmur heard. Pulmonary:     Effort: No respiratory distress.     Breath sounds: Normal breath sounds.  Musculoskeletal:     Cervical back: Normal range of motion and neck supple.  Neurological:     Mental Status: Keith Blake is alert and oriented to person, place, and time.       Assessment & Plan:   Diagnoses and all orders for this visit:  Essential hypertension       I have discontinued Keith Blake's allopurinol. I am also having him maintain his EPINEPHrine, colchicine, fluticasone, and bisoprolol-hydrochlorothiazide.  Allergies as of 01/14/2023       Reactions   Penicillins Rash   hives  Medication List        Accurate as of January 14, 2023  4:06 PM. If you have any questions, ask your nurse or doctor.          STOP taking these medications    allopurinol 300 MG tablet Commonly known as: ZYLOPRIM Stopped by: Denali Becvar       TAKE these medications    bisoprolol-hydrochlorothiazide 5-6.25 MG tablet Commonly known as: ZIAC Take 1 tablet by mouth daily. (NEEDS TO BE SEEN BEFORE NEXT REFILL)   colchicine 0.6 MG tablet Take twice daily for gout attack. (may take every two hours up to 6 doses at acute onset)   EPINEPHrine 0.3 mg/0.3 mL Soaj injection Commonly known as: EPI-PEN Inject 0.3 mLs (0.3 mg total) into the muscle once.   fluticasone 50 MCG/ACT nasal spray Commonly known as: FLONASE Place 2 sprays into both nostrils daily.         Follow-up: No follow-ups on file.  Mechele Claude, M.D.

## 2023-01-17 ENCOUNTER — Encounter: Payer: Self-pay | Admitting: Family Medicine

## 2023-07-15 ENCOUNTER — Other Ambulatory Visit: Payer: Self-pay | Admitting: Family Medicine

## 2023-07-15 DIAGNOSIS — I10 Essential (primary) hypertension: Secondary | ICD-10-CM

## 2023-08-10 ENCOUNTER — Other Ambulatory Visit: Payer: Self-pay | Admitting: Family Medicine

## 2023-08-10 DIAGNOSIS — I10 Essential (primary) hypertension: Secondary | ICD-10-CM

## 2023-08-10 NOTE — Telephone Encounter (Signed)
 Schedule appt for April 3 and pt has enough meds until then

## 2023-08-10 NOTE — Telephone Encounter (Signed)
 Stacks pt NTBS 30-d given 07/16/23

## 2023-08-26 ENCOUNTER — Ambulatory Visit: Admitting: Family Medicine

## 2023-08-26 ENCOUNTER — Encounter: Payer: Self-pay | Admitting: Family Medicine

## 2023-08-26 VITALS — BP 127/82 | HR 65 | Temp 97.7°F | Ht 67.0 in | Wt 260.0 lb

## 2023-08-26 DIAGNOSIS — I10 Essential (primary) hypertension: Secondary | ICD-10-CM

## 2023-08-26 DIAGNOSIS — Z1211 Encounter for screening for malignant neoplasm of colon: Secondary | ICD-10-CM | POA: Diagnosis not present

## 2023-08-26 MED ORDER — BISOPROLOL-HYDROCHLOROTHIAZIDE 5-6.25 MG PO TABS: 1.0000 | ORAL_TABLET | Freq: Every day | ORAL | 1 refills | Status: DC

## 2023-08-26 NOTE — Progress Notes (Signed)
 Subjective:  Patient ID: Keith Blake, male    DOB: May 09, 1966  Age: 58 y.o. MRN: [id number]  CC: Medication Refill (Pended/Would like to discus medications. /Declined interpreter)   HPI Keith Blake presents for  follow-up of hypertension. Patient has no history of headache chest pain or shortness of breath or recent cough. Patient also denies symptoms of TIA such as focal numbness or weakness. Patient denies side effects from medication. States taking it regularly.   History Keith Blake has a past medical history of Hypertension.   He has a past surgical history that includes Umbilical hernia repair and hemorroid banding.   His family history is not on file.He reports that he has never smoked. He has never used smokeless tobacco. He reports current alcohol use. He reports that he does not use drugs.  No current outpatient medications on file prior to visit.   No current facility-administered medications on file prior to visit.    ROS Review of Systems  Constitutional:  Negative for fever.  Respiratory:  Negative for shortness of breath.   Cardiovascular:  Negative for chest pain.  Musculoskeletal:  Negative for arthralgias.  Skin:  Negative for rash.    Objective:  BP 127/82   Pulse 65   Temp 97.7 F (36.5 C)   Ht 5\' 7"  (1.702 m)   Wt 260 lb (117.9 kg)   SpO2 97%   BMI 40.72 kg/m   BP Readings from Last 3 Encounters:  08/26/23 127/82  01/14/23 133/88  04/07/21 134/72    Wt Readings from Last 3 Encounters:  08/26/23 260 lb (117.9 kg)  01/14/23 255 lb (115.7 kg)  04/07/21 244 lb 9.6 oz (110.9 kg)     Physical Exam Vitals reviewed.  Constitutional:      Appearance: He is well-developed.  HENT:     Head: Normocephalic and atraumatic.     Right Ear: External ear normal.     Left Ear: External ear normal.     Mouth/Throat:     Pharynx: No oropharyngeal exudate or posterior oropharyngeal erythema.  Eyes:     Pupils: Pupils are equal, round, and reactive to  light.  Cardiovascular:     Rate and Rhythm: Normal rate and regular rhythm.     Heart sounds: No murmur heard. Pulmonary:     Effort: No respiratory distress.     Breath sounds: Normal breath sounds.  Musculoskeletal:     Cervical back: Normal range of motion and neck supple.  Neurological:     Mental Status: He is alert and oriented to person, place, and time.       Assessment & Plan:  Essential hypertension -     Bisoprolol-hydroCHLOROthiazide; Take 1 tablet by mouth daily. **NEEDS TO BE SEEN BEFORE NEXT REFILL**  Dispense: 90 tablet; Refill: 1 -     CMP14+EGFR -     Lipid panel  Screen for colon cancer -     Ambulatory referral to Gastroenterology    Allergies as of 08/26/2023       Reactions   Penicillins Rash   hives        Medication List        Accurate as of August 26, 2023 11:59 PM. If you have any questions, ask your nurse or doctor.          bisoprolol-hydrochlorothiazide 5-6.25 MG tablet Commonly known as: ZIAC Take 1 tablet by mouth daily. **NEEDS TO BE SEEN BEFORE NEXT REFILL**         Follow-up:  Return in about 6 months (around 02/25/2024) for [employer] physical.  Mechele Claude, M.D.

## 2023-11-19 DIAGNOSIS — K635 Polyp of colon: Secondary | ICD-10-CM | POA: Diagnosis not present

## 2023-11-19 DIAGNOSIS — D122 Benign neoplasm of ascending colon: Secondary | ICD-10-CM | POA: Diagnosis not present

## 2023-11-19 DIAGNOSIS — D123 Benign neoplasm of transverse colon: Secondary | ICD-10-CM | POA: Diagnosis not present

## 2023-11-19 DIAGNOSIS — D120 Benign neoplasm of cecum: Secondary | ICD-10-CM | POA: Diagnosis not present

## 2023-11-19 DIAGNOSIS — K5730 Diverticulosis of large intestine without perforation or abscess without bleeding: Secondary | ICD-10-CM | POA: Diagnosis not present

## 2023-11-19 DIAGNOSIS — Z1211 Encounter for screening for malignant neoplasm of colon: Secondary | ICD-10-CM | POA: Diagnosis not present

## 2023-11-19 LAB — HM COLONOSCOPY

## 2024-03-22 ENCOUNTER — Other Ambulatory Visit: Payer: Self-pay | Admitting: Family Medicine

## 2024-03-22 DIAGNOSIS — I10 Essential (primary) hypertension: Secondary | ICD-10-CM

## 2024-04-15 ENCOUNTER — Other Ambulatory Visit: Payer: Self-pay | Admitting: Family Medicine

## 2024-04-15 DIAGNOSIS — I10 Essential (primary) hypertension: Secondary | ICD-10-CM

## 2024-04-17 ENCOUNTER — Encounter: Payer: Self-pay | Admitting: Family Medicine

## 2024-04-17 NOTE — Telephone Encounter (Signed)
 Stacks pt NTBS 30-d given 03/22/24

## 2024-04-17 NOTE — Telephone Encounter (Signed)
 Called to schedule appt no answer no voicemail Letter mailed

## 2024-04-19 ENCOUNTER — Other Ambulatory Visit: Payer: Self-pay | Admitting: Family Medicine

## 2024-04-19 DIAGNOSIS — I10 Essential (primary) hypertension: Secondary | ICD-10-CM

## 2024-05-02 ENCOUNTER — Other Ambulatory Visit: Payer: Self-pay | Admitting: Family Medicine

## 2024-05-02 ENCOUNTER — Ambulatory Visit: Admitting: Family Medicine

## 2024-05-02 ENCOUNTER — Encounter: Payer: Self-pay | Admitting: Family Medicine

## 2024-05-02 VITALS — BP 164/80 | HR 71 | Temp 98.0°F | Ht 67.0 in | Wt 259.0 lb

## 2024-05-02 DIAGNOSIS — M1A00X0 Idiopathic chronic gout, unspecified site, without tophus (tophi): Secondary | ICD-10-CM

## 2024-05-02 DIAGNOSIS — I10 Essential (primary) hypertension: Secondary | ICD-10-CM

## 2024-05-02 DIAGNOSIS — Z13220 Encounter for screening for lipoid disorders: Secondary | ICD-10-CM

## 2024-05-02 LAB — CBC WITH DIFFERENTIAL/PLATELET
Basophils Absolute: 0 x10E3/uL (ref 0.0–0.2)
Basos: 1 %
EOS (ABSOLUTE): 0.1 x10E3/uL (ref 0.0–0.4)
Eos: 1 %
Hematocrit: 44.4 % (ref 37.5–51.0)
Hemoglobin: 14.5 g/dL (ref 13.0–17.7)
Immature Grans (Abs): 0 x10E3/uL (ref 0.0–0.1)
Immature Granulocytes: 0 %
Lymphocytes Absolute: 2.2 x10E3/uL (ref 0.7–3.1)
Lymphs: 33 %
MCH: 30.7 pg (ref 26.6–33.0)
MCHC: 32.7 g/dL (ref 31.5–35.7)
MCV: 94 fL (ref 79–97)
Monocytes Absolute: 0.5 x10E3/uL (ref 0.1–0.9)
Monocytes: 8 %
Neutrophils Absolute: 3.8 x10E3/uL (ref 1.4–7.0)
Neutrophils: 57 %
Platelets: 225 x10E3/uL (ref 150–450)
RBC: 4.72 x10E6/uL (ref 4.14–5.80)
RDW: 13.2 % (ref 11.6–15.4)
WBC: 6.7 x10E3/uL (ref 3.4–10.8)

## 2024-05-02 LAB — CMP14+EGFR
ALT: 27 IU/L (ref 0–44)
AST: 27 IU/L (ref 0–40)
Albumin: 4.2 g/dL (ref 3.8–4.9)
Alkaline Phosphatase: 63 IU/L (ref 47–123)
BUN/Creatinine Ratio: 15 (ref 9–20)
BUN: 12 mg/dL (ref 6–24)
Bilirubin Total: 0.7 mg/dL (ref 0.0–1.2)
CO2: 23 mmol/L (ref 20–29)
Calcium: 9.4 mg/dL (ref 8.7–10.2)
Chloride: 102 mmol/L (ref 96–106)
Creatinine, Ser: 0.79 mg/dL (ref 0.76–1.27)
Globulin, Total: 2.6 g/dL (ref 1.5–4.5)
Glucose: 107 mg/dL — ABNORMAL HIGH (ref 70–99)
Potassium: 4 mmol/L (ref 3.5–5.2)
Sodium: 138 mmol/L (ref 134–144)
Total Protein: 6.8 g/dL (ref 6.0–8.5)
eGFR: 103 mL/min/1.73 (ref >59)

## 2024-05-02 LAB — LIPID PANEL
Chol/HDL Ratio: 3.7 ratio (ref 0.0–5.0)
Cholesterol, Total: 179 mg/dL (ref 100–199)
HDL: 49 mg/dL (ref >39)
LDL Chol Calc (NIH): 82 mg/dL (ref 0–99)
Triglycerides: 295 mg/dL — ABNORMAL HIGH (ref 0–149)
VLDL Cholesterol Cal: 48 mg/dL — ABNORMAL HIGH (ref 5–40)

## 2024-05-02 MED ORDER — BISOPROLOL-HYDROCHLOROTHIAZIDE 5-6.25 MG PO TABS: 1.0000 | ORAL_TABLET | Freq: Every day | ORAL | 0 refills | Status: DC

## 2024-05-02 NOTE — Progress Notes (Signed)
 Subjective:  Patient ID: Keith Blake, male    DOB: 1966/01/17  Age: 58 y.o. MRN: [id number]  CC: Medication Refill (pended)   HPI Keith Blake presents for  follow-up of hypertension. Patient has no history of headache chest pain or shortness of breath or recent cough. Patient also denies symptoms of TIA such as focal numbness or weakness. Patient denies side effects from medication. States taking it regularly.   History Keith Blake has a past medical history of Hypertension.   He has a past surgical history that includes Umbilical hernia repair and hemorroid banding.   His family history is not on file.He reports that he has never smoked. He has never used smokeless tobacco. He reports current alcohol use. He reports that he does not use drugs.      History of Present Illness Keith Blake is a 58 year old male with hypertension who presents for a medication refill.  He has been unable to refill his blood pressure medication, Ziac  (bisoprolol  and hydrochlorothiazide ), without a doctor's visit and has not taken it for the past week, which may have contributed to elevated blood pressure readings. He has not been monitoring his blood pressure regularly at home. No rapid heartbeat or chest pounding.  He underwent a colonoscopy on June 27th, 2025, where polyps were removed. He is due for a follow-up in five years.  There is a noted lapse in checking his cholesterol levels, which have not been assessed in approximately five years. He recalls an attempt to have blood drawn during a previous visit, but the order was not processed correctly, and he was not contacted for follow-up.  He mentions potential travel plans for Christmas, possibly driving to [place]  or [place], [place] , which would involve a significant road trip.   History of gout. No recent attack. Due for Uric acid level. Also cholesterol due for follow up.Hasn't been checked for several years.  Not on med or diet.    ROS Review of Systems  Constitutional: Negative.   HENT: Negative.    Eyes:  Negative for visual disturbance.  Respiratory:  Negative for cough and shortness of breath.   Cardiovascular:  Negative for chest pain and leg swelling.  Gastrointestinal:  Negative for abdominal pain, diarrhea, nausea and vomiting.  Genitourinary:  Negative for difficulty urinating.  Musculoskeletal:  Negative for arthralgias and myalgias.  Skin:  Negative for rash.  Neurological:  Negative for headaches.  Psychiatric/Behavioral:  Negative for sleep disturbance.     Objective:  BP (!) 164/80   Pulse 71   Temp 98 F (36.7 C)   Ht 5' 7 (1.702 m)   Wt 259 lb (117.5 kg)   SpO2 99%   BMI 40.57 kg/m   BP Readings from Last 3 Encounters:  05/02/24 (!) 164/80  08/26/23 127/82  01/14/23 133/88    Wt Readings from Last 3 Encounters:  05/02/24 259 lb (117.5 kg)  08/26/23 260 lb (117.9 kg)  01/14/23 255 lb (115.7 kg)     Physical Exam Vitals reviewed.  Constitutional:      Appearance: He is well-developed.  HENT:     Head: Normocephalic and atraumatic.     Right Ear: External ear normal.     Left Ear: External ear normal.     Mouth/Throat:     Pharynx: No oropharyngeal exudate or posterior oropharyngeal erythema.  Eyes:     Pupils: Pupils are equal, round, and reactive to light.  Cardiovascular:     Rate and Rhythm: Normal rate and  regular rhythm.     Heart sounds: No murmur heard. Pulmonary:     Effort: No respiratory distress.     Breath sounds: Normal breath sounds.  Musculoskeletal:     Cervical back: Normal range of motion and neck supple.  Neurological:     Mental Status: He is alert and oriented to person, place, and time.       Assessment & Plan:  Idiopathic chronic gout without tophus, unspecified site -     Uric acid  Essential hypertension -     Bisoprolol -hydroCHLOROthiazide ; Take 1 tablet by mouth daily. **NEEDS TO BE SEEN BEFORE NEXT REFILL**  Dispense: 30  tablet; Refill: 0 -     CBC with Differential/Platelet -     CMP14+EGFR -     Lipid panel  Lipid screening -     Lipid panel    Allergies as of 05/02/2024       Reactions   Penicillins Rash   hives        Medication List        Accurate as of May 02, 2024 10:43 AM. If you have any questions, ask your nurse or doctor.          bisoprolol -hydrochlorothiazide  5-6.25 MG tablet Commonly known as: ZIAC  Take 1 tablet by mouth daily. **NEEDS TO BE SEEN BEFORE NEXT REFILL**         Follow-up: No follow-ups on file.  Butler Der, M.D.

## 2024-05-03 ENCOUNTER — Ambulatory Visit: Payer: Self-pay | Admitting: Family Medicine

## 2024-05-05 LAB — SPECIMEN STATUS REPORT

## 2024-05-05 LAB — URIC ACID: Uric Acid: 6.7 mg/dL (ref 3.8–8.4)

## 2024-05-06 NOTE — Progress Notes (Signed)
Hello Keith Blake,  Your lab result is normal and/or stable.Some minor variations that are not significant are commonly marked abnormal, but do not represent any medical problem for you.  Best regards, Mechele Claude, M.D.

## 2024-05-27 ENCOUNTER — Other Ambulatory Visit: Payer: Self-pay | Admitting: Family Medicine

## 2024-05-27 DIAGNOSIS — I10 Essential (primary) hypertension: Secondary | ICD-10-CM

## 2024-05-29 ENCOUNTER — Encounter: Payer: Self-pay | Admitting: Family Medicine

## 2024-05-29 NOTE — Telephone Encounter (Signed)
 LMTCB to schedule appt Letter mailed

## 2024-05-29 NOTE — Telephone Encounter (Signed)
 Stacks pt NTBS 30-d given 05/02/24

## 2024-05-30 ENCOUNTER — Telehealth: Payer: Self-pay | Admitting: Family Medicine

## 2024-05-30 MED ORDER — BISOPROLOL-HYDROCHLOROTHIAZIDE 5-6.25 MG PO TABS: 1.0000 | ORAL_TABLET | Freq: Every day | ORAL | 1 refills | Status: AC

## 2024-05-30 NOTE — Telephone Encounter (Signed)
 Already refilled

## 2024-05-30 NOTE — Telephone Encounter (Signed)
 Pt was seen on 05/02/24 sending in RF

## 2024-05-30 NOTE — Telephone Encounter (Unsigned)
 Copied from CRM [id number]. Topic: Clinical - Medication Refill >> May 30, 2024  9:09 AM Harlene ORN wrote: Medication: bisoprolol -hydrochlorothiazide  (ZIAC ) 5-6.25 MG tablet  Has the patient contacted their pharmacy? Yes (Agent: If no, request that the patient contact the pharmacy for the refill. If patient does not wish to contact the pharmacy document the reason why and proceed with request.) (Agent: If yes, when and what did the pharmacy advise?)  This is the patient's preferred pharmacy:  [id number] - [place], [place] - [address] [place] [address] Phone: [phone] Fax: [phone]  Is this the correct pharmacy for this prescription? Yes If no, delete pharmacy and type the correct one.   Has the prescription been filled recently? Yes  Is the patient out of the medication? Yes  Has the patient been seen for an appointment in the last year OR does the patient have an upcoming appointment? Yes  Can we respond through MyChart? No  Agent: Please be advised that Rx refills may take up to 3 business days. We ask that you follow-up with your pharmacy.

## 2024-05-30 NOTE — Addendum Note (Signed)
 Addended by: INA RAMP D on: 05/30/2024 09:16 AM   Modules accepted: Orders

## 2024-05-31 ENCOUNTER — Ambulatory Visit: Admitting: Family Medicine

## 2024-05-31 ENCOUNTER — Encounter: Payer: Self-pay | Admitting: Family Medicine

## 2024-05-31 VITALS — BP 121/71 | HR 70 | Temp 98.3°F | Ht 67.0 in | Wt 256.4 lb

## 2024-05-31 DIAGNOSIS — I10 Essential (primary) hypertension: Secondary | ICD-10-CM

## 2024-05-31 NOTE — Progress Notes (Signed)
" ° °  Established Patient Office Visit  Subjective   Patient ID: Keith Blake, male    DOB: [birth year]  Age: 59 y.o. MRN: [id number]  Chief Complaint  Patient presents with   Medical Management of Chronic Issues    HPI  Keith Blake is here for a BP recheck. He was seen last month by his PCP. He had been out of his medication for his BP for about 1 week. His BP was elevated at the visit. He had labs done at the visit. He has been compliant with his medication daily for the last month. Denies chest pain, shortness of breath, edema, dizziness, HA, visual disturbances.     ROS As per HPI.   Objective:     BP 121/71   Pulse 70   Temp 98.3 F (36.8 C) (Temporal)   Ht 5' 7 (1.702 m)   Wt 256 lb 6.4 oz (116.3 kg)   SpO2 97%   BMI 40.16 kg/m    Physical Exam Vitals and nursing note reviewed.  Constitutional:      General: He is not in acute distress.    Appearance: He is obese. He is not ill-appearing, toxic-appearing or diaphoretic.  Cardiovascular:     Rate and Rhythm: Normal rate and regular rhythm.     Pulses: Normal pulses.     Heart sounds: Normal heart sounds. No murmur heard. Pulmonary:     Effort: Pulmonary effort is normal. No respiratory distress.     Breath sounds: Normal breath sounds.  Musculoskeletal:     Right lower leg: No edema.     Left lower leg: No edema.  Skin:    General: Skin is warm and dry.  Neurological:     General: No focal deficit present.     Mental Status: He is alert and oriented to person, place, and time.  Psychiatric:        Mood and Affect: Mood normal.        Behavior: Behavior normal.      No results found for any visits on 05/31/24.    The 10-year ASCVD risk score (Arnett DK, et al., 2019) is: 9.5%    Assessment & Plan:   Keith Blake was seen today for medical management of chronic issues.  Diagnoses and all orders for this visit:  Essential hypertension  BP at goal. Continue current medication.    Return in about 3  months (around 08/29/2024) for chronic follow up with PCP.   The patient indicates understanding of these issues and agrees with the plan.  Keith CHRISTELLA Search, FNP "
